# Patient Record
Sex: Female | Born: 1960 | Race: White | Hispanic: No | Marital: Single | Smoking: Current every day smoker
Health system: Southern US, Community
[De-identification: ages and names within clinical notes are randomized; demographics above are authoritative.]

## PROBLEM LIST (undated history)

## (undated) DIAGNOSIS — F319 Bipolar disorder, unspecified: Secondary | ICD-10-CM

## (undated) DIAGNOSIS — N289 Disorder of kidney and ureter, unspecified: Secondary | ICD-10-CM

## (undated) DIAGNOSIS — M1990 Unspecified osteoarthritis, unspecified site: Secondary | ICD-10-CM

## (undated) HISTORY — PX: TUBAL LIGATION: SHX77

---

## 1997-08-05 ENCOUNTER — Encounter (HOSPITAL_COMMUNITY): Admission: RE | Admit: 1997-08-05 | Discharge: 1997-11-03 | Payer: Self-pay

## 2002-05-18 ENCOUNTER — Emergency Department (HOSPITAL_COMMUNITY): Admission: EM | Admit: 2002-05-18 | Discharge: 2002-05-18 | Payer: Self-pay | Admitting: Emergency Medicine

## 2002-05-18 ENCOUNTER — Encounter: Payer: Self-pay | Admitting: Emergency Medicine

## 2003-01-11 ENCOUNTER — Encounter
Admission: RE | Admit: 2003-01-11 | Discharge: 2003-04-11 | Payer: Self-pay | Admitting: Physical Medicine & Rehabilitation

## 2003-04-15 ENCOUNTER — Encounter
Admission: RE | Admit: 2003-04-15 | Discharge: 2003-07-14 | Payer: Self-pay | Admitting: Physical Medicine & Rehabilitation

## 2003-09-08 ENCOUNTER — Encounter
Admission: RE | Admit: 2003-09-08 | Discharge: 2003-12-07 | Payer: Self-pay | Admitting: Physical Medicine & Rehabilitation

## 2003-12-14 ENCOUNTER — Encounter
Admission: RE | Admit: 2003-12-14 | Discharge: 2004-03-13 | Payer: Self-pay | Admitting: Physical Medicine & Rehabilitation

## 2004-01-07 ENCOUNTER — Ambulatory Visit: Payer: Self-pay | Admitting: Physical Medicine & Rehabilitation

## 2004-04-04 ENCOUNTER — Encounter
Admission: RE | Admit: 2004-04-04 | Discharge: 2004-07-03 | Payer: Self-pay | Admitting: Physical Medicine & Rehabilitation

## 2004-04-06 ENCOUNTER — Ambulatory Visit: Payer: Self-pay | Admitting: Physical Medicine & Rehabilitation

## 2004-07-03 ENCOUNTER — Encounter
Admission: RE | Admit: 2004-07-03 | Discharge: 2004-10-01 | Payer: Self-pay | Admitting: Physical Medicine & Rehabilitation

## 2004-07-05 ENCOUNTER — Ambulatory Visit: Payer: Self-pay | Admitting: Physical Medicine & Rehabilitation

## 2004-10-03 ENCOUNTER — Encounter
Admission: RE | Admit: 2004-10-03 | Discharge: 2005-01-01 | Payer: Self-pay | Admitting: Physical Medicine & Rehabilitation

## 2004-10-03 ENCOUNTER — Ambulatory Visit: Payer: Self-pay | Admitting: Physical Medicine & Rehabilitation

## 2004-12-05 ENCOUNTER — Ambulatory Visit: Payer: Self-pay | Admitting: Physical Medicine & Rehabilitation

## 2005-02-06 ENCOUNTER — Ambulatory Visit: Payer: Self-pay | Admitting: Physical Medicine & Rehabilitation

## 2005-02-06 ENCOUNTER — Encounter
Admission: RE | Admit: 2005-02-06 | Discharge: 2005-05-07 | Payer: Self-pay | Admitting: Physical Medicine & Rehabilitation

## 2005-04-03 ENCOUNTER — Ambulatory Visit: Payer: Self-pay | Admitting: Physical Medicine & Rehabilitation

## 2005-07-03 ENCOUNTER — Encounter
Admission: RE | Admit: 2005-07-03 | Discharge: 2005-10-01 | Payer: Self-pay | Admitting: Physical Medicine & Rehabilitation

## 2005-07-31 ENCOUNTER — Ambulatory Visit: Payer: Self-pay | Admitting: Physical Medicine & Rehabilitation

## 2005-11-30 ENCOUNTER — Encounter
Admission: RE | Admit: 2005-11-30 | Discharge: 2006-02-28 | Payer: Self-pay | Admitting: Physical Medicine & Rehabilitation

## 2005-11-30 ENCOUNTER — Ambulatory Visit: Payer: Self-pay | Admitting: Physical Medicine & Rehabilitation

## 2006-03-15 ENCOUNTER — Encounter
Admission: RE | Admit: 2006-03-15 | Discharge: 2006-06-13 | Payer: Self-pay | Admitting: Physical Medicine & Rehabilitation

## 2006-03-15 ENCOUNTER — Ambulatory Visit: Payer: Self-pay | Admitting: Physical Medicine & Rehabilitation

## 2006-04-26 ENCOUNTER — Ambulatory Visit: Payer: Self-pay | Admitting: Physical Medicine & Rehabilitation

## 2006-06-05 ENCOUNTER — Ambulatory Visit: Payer: Self-pay | Admitting: Physical Medicine & Rehabilitation

## 2006-08-01 ENCOUNTER — Ambulatory Visit: Payer: Self-pay | Admitting: Physical Medicine & Rehabilitation

## 2006-08-01 ENCOUNTER — Encounter
Admission: RE | Admit: 2006-08-01 | Discharge: 2006-10-30 | Payer: Self-pay | Admitting: Physical Medicine & Rehabilitation

## 2006-10-25 ENCOUNTER — Ambulatory Visit: Payer: Self-pay | Admitting: Physical Medicine & Rehabilitation

## 2007-01-17 ENCOUNTER — Encounter
Admission: RE | Admit: 2007-01-17 | Discharge: 2007-01-20 | Payer: Self-pay | Admitting: Physical Medicine & Rehabilitation

## 2007-03-27 ENCOUNTER — Encounter
Admission: RE | Admit: 2007-03-27 | Discharge: 2007-03-28 | Payer: Self-pay | Admitting: Physical Medicine & Rehabilitation

## 2007-03-27 ENCOUNTER — Ambulatory Visit: Payer: Self-pay | Admitting: Physical Medicine & Rehabilitation

## 2007-05-24 ENCOUNTER — Inpatient Hospital Stay (HOSPITAL_COMMUNITY): Admission: EM | Admit: 2007-05-24 | Discharge: 2007-06-04 | Payer: Self-pay | Admitting: Emergency Medicine

## 2007-05-25 ENCOUNTER — Ambulatory Visit: Payer: Self-pay | Admitting: Cardiology

## 2007-05-26 ENCOUNTER — Encounter: Payer: Self-pay | Admitting: Nephrology

## 2007-05-29 ENCOUNTER — Encounter: Payer: Self-pay | Admitting: Cardiology

## 2007-06-02 ENCOUNTER — Encounter (INDEPENDENT_AMBULATORY_CARE_PROVIDER_SITE_OTHER): Payer: Self-pay | Admitting: Interventional Radiology

## 2007-07-15 ENCOUNTER — Encounter
Admission: RE | Admit: 2007-07-15 | Discharge: 2007-07-18 | Payer: Self-pay | Admitting: Physical Medicine & Rehabilitation

## 2007-07-18 ENCOUNTER — Ambulatory Visit: Payer: Self-pay | Admitting: Physical Medicine & Rehabilitation

## 2007-11-13 ENCOUNTER — Encounter
Admission: RE | Admit: 2007-11-13 | Discharge: 2007-11-14 | Payer: Self-pay | Admitting: Physical Medicine & Rehabilitation

## 2007-11-14 ENCOUNTER — Ambulatory Visit: Payer: Self-pay | Admitting: Physical Medicine & Rehabilitation

## 2008-02-03 ENCOUNTER — Other Ambulatory Visit: Payer: Self-pay | Admitting: Emergency Medicine

## 2008-02-03 ENCOUNTER — Other Ambulatory Visit: Payer: Self-pay

## 2008-02-27 ENCOUNTER — Encounter
Admission: RE | Admit: 2008-02-27 | Discharge: 2008-05-24 | Payer: Self-pay | Admitting: Physical Medicine & Rehabilitation

## 2008-04-02 ENCOUNTER — Ambulatory Visit: Payer: Self-pay | Admitting: Physical Medicine & Rehabilitation

## 2008-05-24 ENCOUNTER — Encounter
Admission: RE | Admit: 2008-05-24 | Discharge: 2008-05-24 | Payer: Self-pay | Admitting: Physical Medicine & Rehabilitation

## 2008-12-11 ENCOUNTER — Emergency Department (HOSPITAL_COMMUNITY): Admission: EM | Admit: 2008-12-11 | Discharge: 2008-12-12 | Payer: Self-pay | Admitting: Emergency Medicine

## 2009-03-02 IMAGING — CR DG CHEST 1V PORT
1 series · 1 of 1 positions shown · non-contrast
Comparison: Fluoroscopic spot film from 05/26/2007 dialysis catheter placement.

CLINICAL DATA: 46-year-old female on dialysis, having shortness of breath.  
 PORTABLE CHEST - 1 VIEW:

[AP]
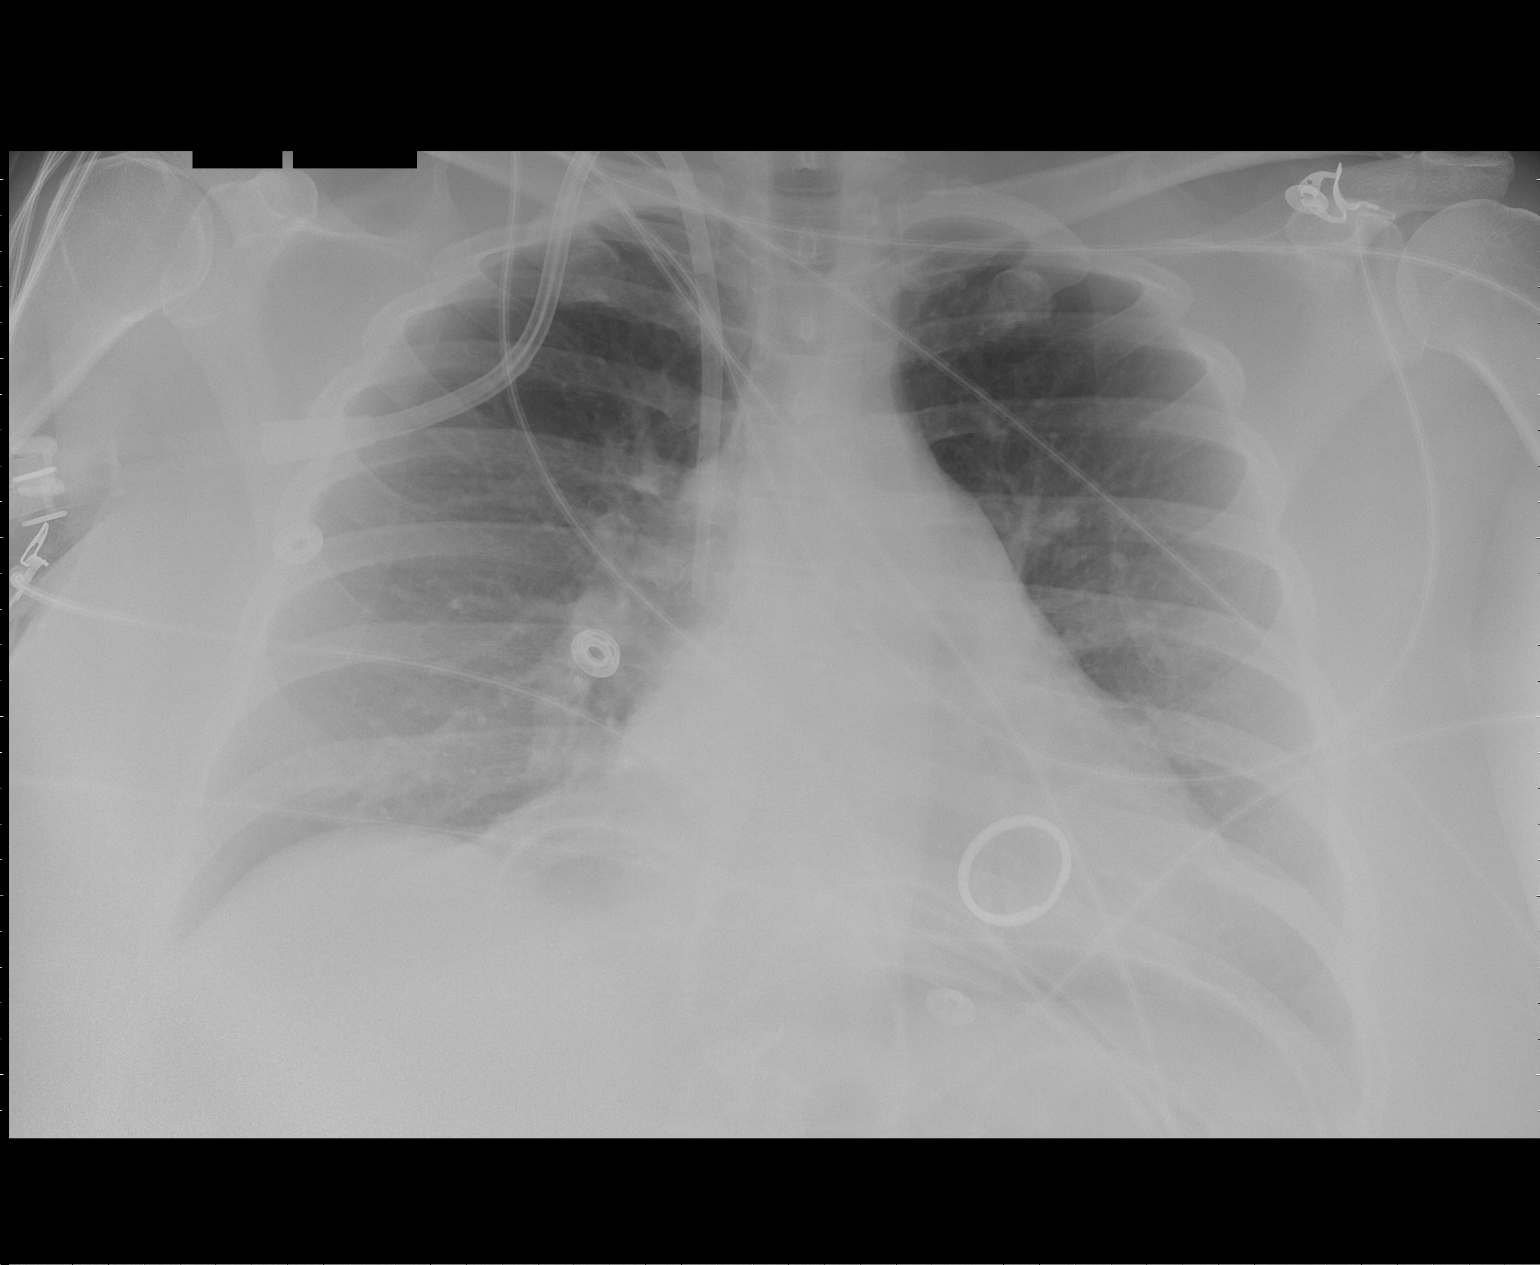

[1 of 1 positions shown; findings below may reference images not displayed]

FINDINGS: Metallic ring projects over the left heart border and is presumably external to the patient.  The right internal jugular approach dialysis catheter is stable in configuration. The tip is in the lower SVC level.  Cardiac size is normal for portable technique.  Mediastinal contour is within normal limits.  No pneumothorax.  There is hazy opacity at the left lung base which may reflect a small effusion, no definite right effusion is identified.  No consolidation is seen.  There is pulmonary vascular congestion without overt edema.  No acute osseous abnormality identified.  There is mild gaseous distention of the stomach.
IMPRESSION: 1.  Stable right IJ approach dialysis catheter, no pneumothorax.  
 2.  Pulmonary vascular congestion and possible small left effusion without overt pulmonary edema.  
 3.   Presumably external metallic artifact projecting over the heart.

## 2009-03-09 IMAGING — US US BIOPSY
1 series · 10 of 10 positions shown · non-contrast
Comparison: none

CLINICAL DATA: 46 year old female; acute renal insufficiency, hepatitis.
ULTRASOUND-GUIDED LEFT RENAL CORTEX CORE BIOPSY ? 06/02/07: 
Radiologist:  Tiger, Nala.   
Guidance:  Ultrasound.
Complications:  No immediate.
Medications: 1 mg Versed and 50 mcg fentanyl.
Procedure/Findings:   Informed consent was obtained from the patient following explanation of the procedure, risks, benefits, and alternatives.  The patient understands, agrees, and consents to the procedure.   All questions were addressed.   Time Out was performed.  
The patient was positioned prone.  Preliminary imaging was performed at the left kidney lower pole.  The overall skin was marked.  Under sterile conditions and local anesthesia, a 15 cm, 16 gauge core biopsy needle was advanced to the lower pole cortex.  Needle position was confirmed with ultrasound.  Core biopsy was obtained of the cortex.  This was performed four times.  Needle position was confirmed with ultrasound.  Images were obtained for documentation.  Samples were adequate in size and reviewed with the pathology assistant.  
The needle was removed.  Hemostasis was obtained with compression.  No immediate complications.

[Series 1: unknown · 0.33mm/px · 10 of 10 slices shown]
[im 1/10]
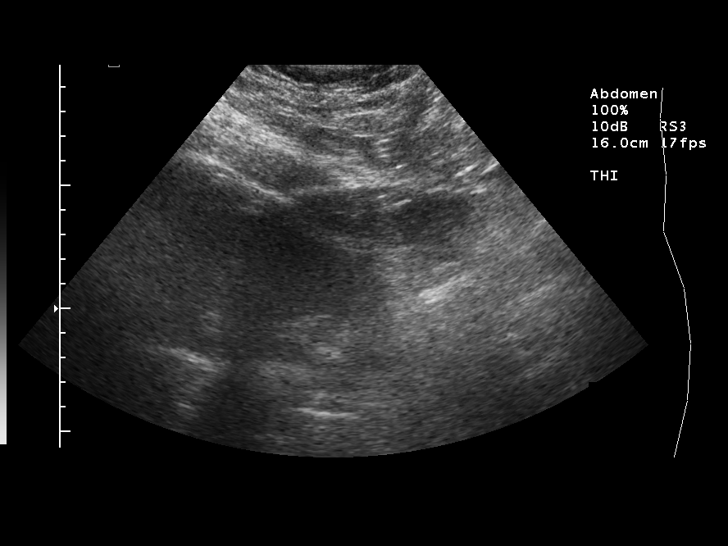
[im 2/10]
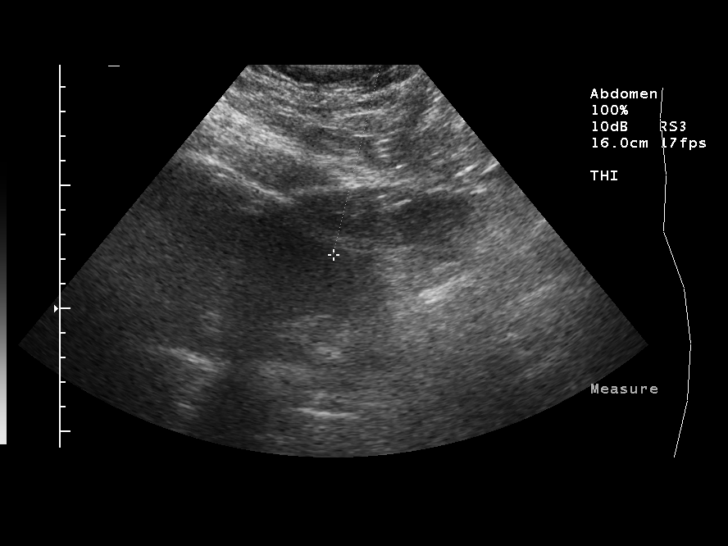
[im 3/10]
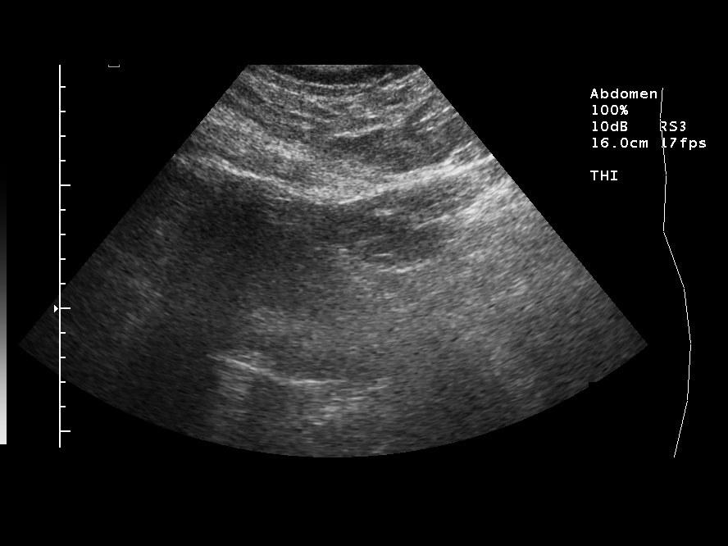
[im 4/10]
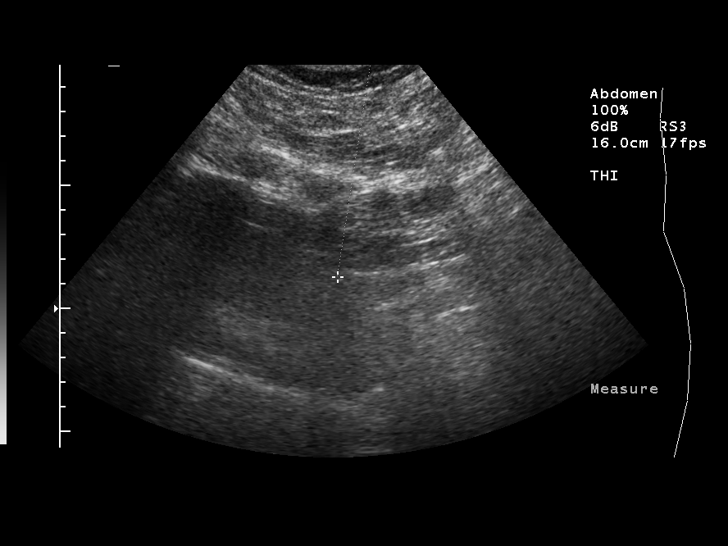
[im 5/10]
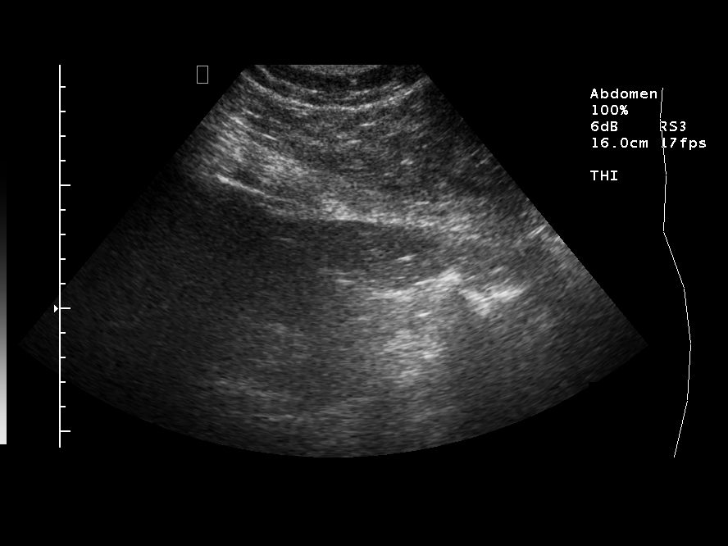
[im 6/10]
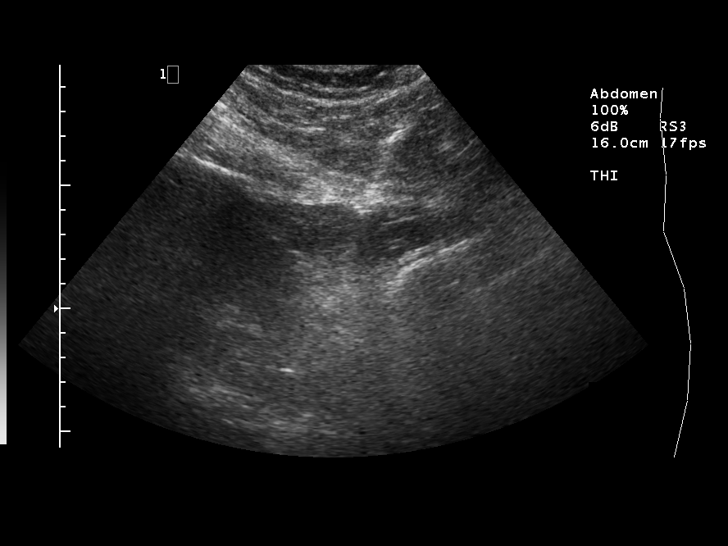
[im 7/10]
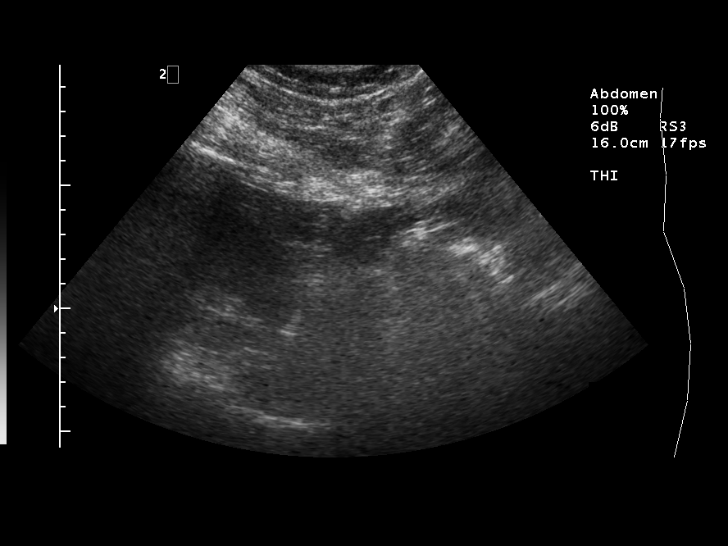
[im 8/10]
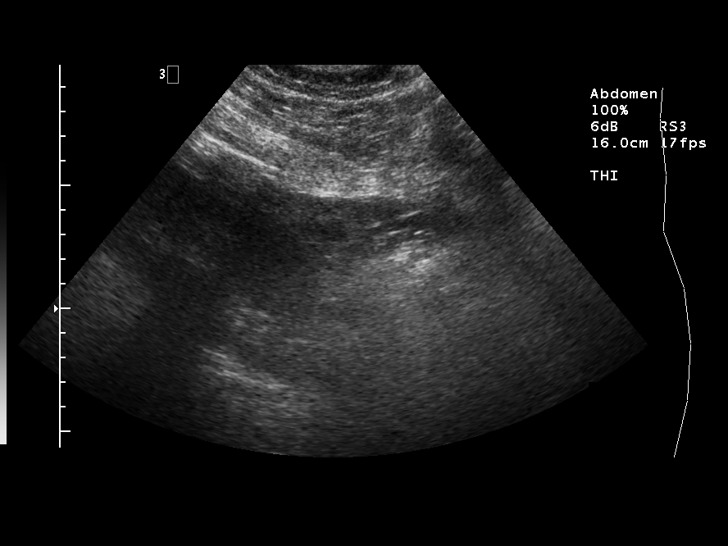
[im 9/10]
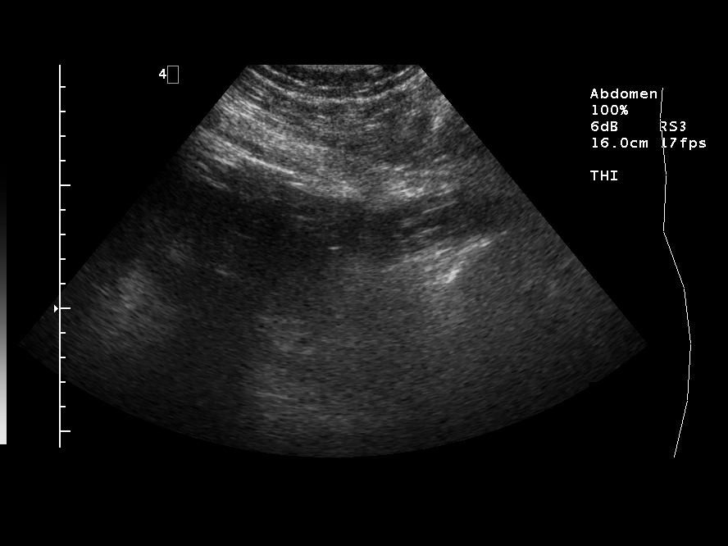
[im 10/10]
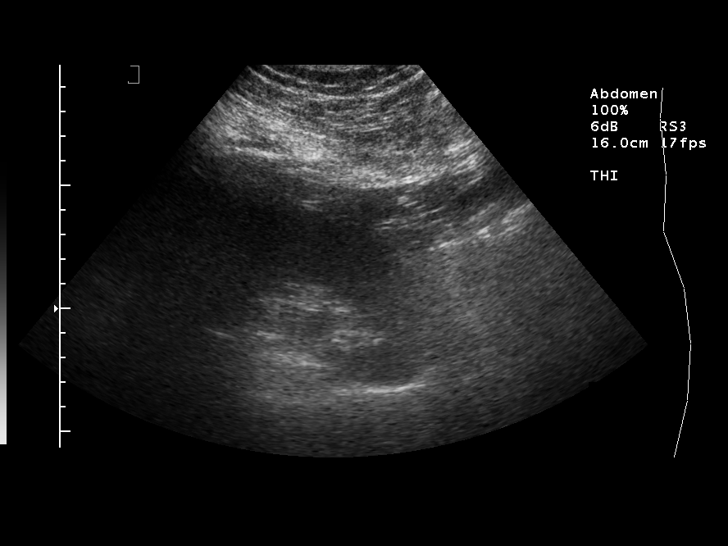

[10 of 10 positions shown; findings below may reference images not displayed]

IMPRESSION: Ultrasound-guided left renal lower pole cortex core biopsy (four 16 gauge core biopsies).

## 2009-03-28 ENCOUNTER — Emergency Department (HOSPITAL_COMMUNITY): Admission: EM | Admit: 2009-03-28 | Discharge: 2009-03-28 | Payer: Self-pay | Admitting: Emergency Medicine

## 2010-04-01 ENCOUNTER — Encounter: Payer: Self-pay | Admitting: Internal Medicine

## 2010-04-02 ENCOUNTER — Encounter: Payer: Self-pay | Admitting: Gastroenterology

## 2010-07-25 NOTE — H&P (Signed)
NAMEGUY, TONEY NO.:  [id number]   MEDICAL RECORD NO.:  [id number]          PATIENT TYPE:  INP   LOCATION:  1445                         FACILITY:  [facility]   PHYSICIAN:  Beckey Rutter, MD  DATE OF BIRTH:  09-29-1960   DATE OF ADMISSION:  05/24/2007  DATE OF DISCHARGE:                              HISTORY & PHYSICAL   PRIMARY CARE PHYSICIAN:  Unassigned to [facility].   CHIEF COMPLAINT:  Nausea and vomiting.   HISTORY OF PRESENT ILLNESS:  This is a 50 year old very pleasant  Caucasian female with past medical history significant for bipolar  disorder, obesity, chronic back pain who presented today with nausea and  vomiting.  The patient started to experience this nausea and vomiting  for the last week or so.  She denied fevers, diarrhea, but she stated  that her abdomen is sore from vomiting.  She noticed a small amount of  blood in her emesis this morning.  The symptom was described as moderate  and she could not recognize any aggravating factor.  No relieving factor  as well.  She has some dizziness but denied any similar symptoms  previously.  The patient has been visiting the pain clinic and the psych  clinic but denied any primary physician visitation.  The patient was on  multiple psychotropic medications but since she was discharged from  [facility] she is no longer taking them.   PAST MEDICAL HISTORY:  Significant for obesity, bipolar disorder,  chronic back pain.   SOCIAL HISTORY:  No drug abuse.  No ethanol abuse, not a drinker.  Lives  with her husband and family.   FAMILY HISTORY:  Noncontributory.  The patient has a twin sister.  Her  twin sister is alive and well.   MEDICATIONS:  1. Methadone 30 mg p.o. t.i.d.  2. Vicodin t.i.d. p.r.n.   REVIEW OF SYSTEMS:  Her review of systems is noncontributory.  The  patient is complaining of a hoarse voice which she stated is secondary  to the multiple vomiting the patient is experiencing  now.   PHYSICAL EXAMINATION:  VITAL SIGNS:  On examination temperature 97.9,  blood pressure 150/85, pulse 69, respiratory rate 18.  HEENT:  Head atraumatic, normocephalic.  PERRLA.  Mouth moist.  No  ulcer.  NECK:  Supple.  No JVD.  LUNGS:  Decreased air entry bilaterally.  PRECORDIUM:  Distal heart sounds could not readily appreciate.  ABDOMEN:  Obese, nontender.  Bowel sounds present.  EXTREMITIES:  No lower extremity edema.  GENITOURINARY:  No CVA tenderness.   LABS:  Urine microscopic showing many bacteria.  Urinalysis is showing  pH of 6.9, 30 mg of protein, negative nitrates and negative leukocyte  esterase.  Urine for pregnancy is negative.  Lipase is 23, sodium is  122, potassium 3.7, chloride 84, bicarb 19, glucose 123, BUN is 106,  creatinine is 12.63.  White blood count is 6.5, hemoglobin is 12.2,  hematocrit 35.6, platelet count is 167.   This is a 50 year old who presented with acute renal failure.  It is not  sure if this is acute on chronic  picture because there is no  documentation of any renal function tests or creatinine in the past few  months.  It is also not clear why the patient has acute renal failure  now.  She was not taking nephrotoxic medication at least as per the  history.   PLAN:  1. The patient will be admitted to the telemetry floor for further      assessment and management.  2. We will refrain from prescribing any nephrotoxic medication and we      will continue to monitor her renal function.  3. We will obtain a nephrology consultation.  4. For hyponatremia will have strict I's and O's monitoring and we      will check the complement levels.  Will have the blood osmolality      and the urine osmolality checked as well.  5. For DVT prophylaxis will consider Lovenox; for GI prophylaxis will      start Protonix.      Beckey Rutter, MD  Electronically Signed     EME/MEDQ  D:  05/24/2007  T:  05/25/2007  Job:  [id number]

## 2010-07-25 NOTE — Assessment & Plan Note (Signed)
Christina Rocha returns to the clinic today for follow-up evaluation.  Overall, she is doing well.  She does need refills on several medicines  in the office today.  She is off Effexor, Adderall and Trileptal at this  point, and only takes Neurontin for psychiatric problems.   MEDICATIONS:  1. Ambien 10 mg p.o. q.h.s.  2. Neurontin 600 mg q.i.d.  3. Hydrocodone 5/325 one tablet q.i.d. p.r.n. (approximately four per      day).  4. Methadone 10 mg three tablets t.i.d. (approximately nine per day).  5. Zanaflex 4 mg q.i.d.  6. ReQuip 0.25 mg b.i.d. p.r.n.  7. Lidoderm patches 5% on 12 hours and off 12 hours daily.   REVIEW OF SYSTEMS:  Positive for constipation and poor appetite.   PHYSICAL EXAMINATION:  Well-appearing, moderately-overweight adult  female in mild to no acute discomfort.  Blood pressure is 108/56 with a  pulse of 49, respiratory rate 22, and O2 saturation 98% on room air.  She has 5-/5 strength throughout the bilateral upper and lower  extremities.  She ambulates without any assistive device.   IMPRESSION:  1. Chronic low-back pain with evidence of moderate lumbar      spondylosis/degenerative disk disease.  2. Sacroiliac joint pain.  3. Bilateral hip pain.   In the office today we did refill the patient's Zanaflex along with her  methadone, Neurontin and Ambien.  No refill is necessary on the Lidoderm  patch or hydrocodone at this point.  Will plan on seeing her in follow-  up in approximately 4 months' time.           ______________________________  Ellwood Dense, M.D.     DC/MedQ  D:  07/18/2007 14:09:47  T:  07/18/2007 14:30:04  Job #:  [id number]

## 2010-07-25 NOTE — Assessment & Plan Note (Signed)
Christina Rocha returns to clinic today for followup evaluation.  She reports  that she is having a lot of anxiousness lately related to her husband's  health.  Apparently, he has gained a lot of weight and she is worried  about his overall health and where that would leave her if he actually  were to have further health problems.  She does report that she has  sufficient supply of hydrocodone, methadone, Zanaflex, and Neurontin at  the present time.  She would like to try Ativan on an as needed basis.   CURRENT MEDICATIONS:  1. Ambien 10 mg p.o. nightly.  2. Neurontin 600 mg q.i.d.  3. Hydrocodone 5/325 one tablet q.i.d. p.r.n. (approximately 4 per      day).  4. Methadone 10 mg 3 tablets t.i.d. (approximately 9 per day).  5. Zanaflex 4 mg q.i.d.  6. ReQuip 0.25 mg b.i.d. p.r.n.  7. Lidoderm patch 5% on 12 hours and off 12 hours daily.   REVIEW OF SYSTEMS:  Positive for constipation, poor appetite, and  coughing.   PHYSICAL EXAMINATION:  GENERAL:  Mildly overweight adult female in mild  acute discomfort.  VITAL SIGNS:  Blood pressure 125/77 with pulse of 62, respiratory rate  18, and O2 saturation 98% on room air.  EXTREMITIES:  She has 5/5 strength throughout the bilaterally upper and  lower extremities.  She ambulates without the assistive device.   IMPRESSION:  1. Chronic low back pain with evidence of moderate lumbar spondylosis.  2. Degenerative disk disease.  3. Sacroiliac joint pain.  4. Bilateral hip pain.   In the office today, we did start the patient on Ativan 2 mg 1 tablet  t.i.d. p.r.n., total of 90.  No refill on the other medicines are  necessary at this time.  We will plan on seeing her in followup in  approximately 3 months' time with refills prior to that appointment if  necessary.           ______________________________  Ellwood Dense, M.D.     DC/MedQ  D:  11/14/2007 12:16:39  T:  11/15/2007 03:07:13  Job #:  [id number]

## 2010-07-25 NOTE — Consult Note (Signed)
Christina Rocha, Christina Rocha NO.:  [id number]   MEDICAL RECORD NO.:  [id number]          PATIENT TYPE:  INP   LOCATION:  3711                         FACILITY:  [facility]   PHYSICIAN:  Madolyn Frieze. Jens Som, MD, FACCDATE OF BIRTH:  1960/12/11   DATE OF CONSULTATION:  05/28/2007  DATE OF DISCHARGE:                                 CONSULTATION   DATE OF ADMISSION:  May 25, 2007.   DATE OF CONSULTATION:  May 28, 2007.   CURRENT ROOM:  37 11   PRIMARY CARE PHYSICIAN:  The patient does not have a primary care  physician   PRIMARY CARDIOLOGIST:  She is new to cardiology, Madolyn Frieze. Jens Som, MD,  [facility]   SUMMARY OF HISTORY:  Ms. Mahnken is a 50 year old white female who was  admitted through [facility] when she presented with a 15-month  history of nausea, vomiting.  This was a gradual onset. However, the  week prior to admission, it became much worse and she developed  hematemesis. Due to worsening symptoms, she presented to the emergency  room. She was found to be in acute renal failure and she was  administered IV fluids without improvement and she has been in the  hospital. She has been started on dialysis and has had this performed on  March 16, 17 and 18 with improvement of her nausea, vomiting.  However,  over the preceding month, she has also noticed increased shortness of  breath and dyspnea on exertion, orthopnea and edema.  She states that  she has probably gained at least 30 pounds over the preceding month.  Since admission, she feels that this has improved only slightly.   We were asked to consult secondary to chest discomfort which she  experienced on March 16.  She described this as a anterior pressure  associated with increased shortness of breath.  However, she denied any  radiation, diaphoresis or change in her nausea. She has never had prior  occurrences.  She states it is usually 6 on a scale of 0 to 10, but  during dialysis it gets up to an 8 on  a scale of 0 to 10.  More  importantly,  the last time it was a 0 was prior to onset.  She is not  sure of any alleviating factors.  She also feels that her weight here in  the hospital is still much higher than baseline, but she is not sure  what her baseline weight is.  She is also concerned given her history of  bipolar disorder that she is not getting her Effexor, trazodone or  Neurontin while she is here in the hospital.   ALLERGIES:  IS NOTABLE FOR ALLERGY TO HALDOL.   CURRENT MEDICATIONS:  Medications prior to admission included:  1. Effexor, unknown dosage.  2. Trazodone 300 mg nightly.  3. Ambien 10 mg nightly.  4. Neurontin 600 mg q.i.d.  5. Zanaflex 4 mg q.i.d.  6. Methadone 30 mg t.i.d.  7. Vicodin 10/325 mg q.i.d.   PAST MEDICAL HISTORY:  1. Is notable for bipolar disorder since the age of 87.  She has had      at least two psych admissions one in August 2002 at [facility] and in 1995 at [facility].  2. She has a history of fibromyalgia.  3. History of degenerative joint disease, lumbar spondylosis and      bilateral hip pain for which she is seen in the Pain Clinic.  4. She also has a history of obesity.   SURGICAL HISTORY:  Is notable for bilateral tubal ligation.   Her BUN and creatinine at [facility] in July 2008 was 8 and 0.7.  She  specifically denies any diabetes, hypertension and no myocardial  infarction, COPD, thyroid disorder, bleeding dyscrasias.  She does not  know her cholesterol is and she has not seen a physician in quite  awhile.   SOCIAL HISTORY:  She resides in [place] with her husband.  She has  one child.  She is on disability and no grandchildren.  She quit smoking  about 6 weeks ago approximately half a pack per day for 20 years.  She  denies any alcohol, drugs or herbal medications, specific diet or  exercise.   FAMILY HISTORY:  Mother is alive at the age of 40 and is alive and well.  Her father is 71 with a history of  Parkinson's. She has a twin sister  age 58 without any health problems.  She does not have any brothers.   REVIEW OF SYSTEMS:  Is notable for recent chills, probable 30-pound  weight gain in the last month or so, a nosebleed on admission.  Positive  snoring per her husband.  He feels that she probably has obstructive  sleep apnea, but she has not been evaluated.  She has had a week long  history of urinary straining.  She also states that her last period was  approximately May 25, 2007 which was 2 weeks early for her.  She  describes a generalized weakness for the last week, depression, anxiety,  particularly this week since she has not been on her medications during  this hospitalization.  She has myalgias and arthralgias all over.  She  also states she has not had a bowel movement in 11 days and does feel  constipated.   CURRENT MEDICATIONS:  In the hospital include:  1. Ambien 10 mg nightly.  2. Protonix 40 mg daily.  3. Lovenox 40 nightly.  4. Nicoderm 7 daily.  5. Solu-Medrol IV 500 mg every day.  6. Nephro-Vite daily.  7. IV Venofer 100 daily.   PHYSICAL EXAM:  GENERAL:  Well-nourished, well-developed, obese white  female who was short of breath while talking. Her husband is present.  Temperature is 98.1, pulse 60 and regular, respirations 18 and regular,  blood pressure 190/94.  Sats 95% on room air.  Intake  since admission  has been approximately 6700 mL, output of 4250.  Her admission weight  was approximately 110 kg at [facility]; today is 112.9 kg.  HEENT:  Is grossly unremarkable.  NECK:  Supple without thyromegaly, adenopathy or carotid bruits.  It is  difficult to assess JVD given her neck fullness.  LUNGS:  Symmetrical excursion.  Breath sounds were diminished, but I did  not hear any overt rales, rhonchi or wheezing.  HEART:  PMI is not displaced and somewhat distant heart sounds; regular  rate and rhythm with a normal S1-S2.  I did not appreciate any  murmurs,  rubs, clicks or gallops.  All pulses are symmetrical  and intact.  I did  not appreciate any abdominal bruits.  SKIN:  Integument also appears to be intact.  ABDOMEN:  Obese.  Bowel sounds present without organomegaly, masses or  tenderness.  EXTREMITIES:  Negative cyanosis or clubbing.  I do not appreciate any  edema.  However, the patient feels that she has slight edema.  MUSCULOSKELETAL and NEURO:  Is unremarkable.   CHEST X-RAY:  On the 16th, showed pulmonary vascular congestion without  edema.  Probable small left effusion.  Renal ultrasound was  unremarkable.  She is status post right IJ vein hemodialysis catheter.  EKG was  performed in the emergency room at [facility] on the 16th;  this showed sinus bradycardia, nonspecific ST-T wave changes.  Subsequent EKG on the 17th, also did not show any changes.  H&H on the  14th was 12.2 and 35.6, normal indices, platelets 167, WBCs 6.5.  Sodium  was low at 122, potassium 3.7, BUN 106, creatinine 12.63, glucose 123.  AST and ALT were significantly elevated at 303 and 23, 42, lipase was  normal at 23.  Urinalysis was positive for protein, glucose and blood.  Serum HCG:  Negative.  HIV was nonreactive.  PTH was elevated at 454.7,  calcium/total calcium was low at 7.1; iron was low at 21, percent sat 7;  B12 greater than 2000, ferritin greater than 374. On the 17th lipase was  13.  CK MBs relative index  x2 and troponin x1 were within normal  limits.  TSH was 0.371. Today, H&H is 10.2 and 30.3, platelets 142, WBCs  8.3, normal indices.  Sodium 129, potassium 4.0, BUN 49, creatinine  8.26, glucose 146, magnesium was 2.1.   IMPRESSION:  1. Acute renal failure requiring hemodialysis.  2. Volume overload probably secondary to renal failure and diastolic      dysfunction.  3. Hypertension that has not been treated.  4. Prolonged atypical chest discomfort without evidence of ischemia      based on EKGs and enzymes.  5. Hyperglycemia  without history of diabetes.  6. Elevated LFTs, probably secondary to volume overload.  7. Wide complex tachycardia.  8. Tobacco use.  9. Normocytic anemia.  10.Hyponatremia.  11.Obesity.  12.Bipolar disorder with a history of depression, currently not      receiving any of her medications.   History is noted per past medical history disposition.  Dr. Jens Som  reviewed the patient's history, spoke with and examined the patient and  agrees with the above.   Recommend checking her hemoglobin A1c given her elevated blood sugars.  We will order an echocardiogram and an adenosine Myoview for further  evaluation of her atypical symptoms.  Given her wide complex tachycardia  in her untreated hypertension, we will begin a low-dose beta blocker as  well as Norvasc.  We will continue to follow.      Joellyn Rued, PA-C      Madolyn Frieze Jens Som, MD, [facility]  Electronically Signed    EW/MEDQ  D:  05/28/2007  T:  05/28/2007  Job:  [id number]   cc:   Ellwood Dense, M.D.  [facility]

## 2010-07-25 NOTE — Assessment & Plan Note (Signed)
Ms. Ozimek returns to the clinic today for followup evaluation.  During  the last clinic visit we had given her lidocaine and Kenalog injections  of her bilateral hips.  She is not sure that she gained much relief from  those.  She has been gaining relief from her medications, specifically  her hydrocodone, methadone and Zanaflex.  She would like to have her  Zanaflex increased to 4 mg four times a day similar to the dose that she  was taking in the hospital recently.  She has a sufficient supply of the  Neurontin and Lidoderm patches.  The patient reports that she would also  like [place] to fill the trazodone as she is having trouble getting that  filled from her primary care physician.   MEDICATIONS:  1. Effexor 75 mg one tablet daily.  2. Adderall 20 mg b.i.d.  3. Ambien 10 mg q.h.s.  4. Trileptal 50 mg b.i.d.  5. Neurontin 600 mg q.i.d.  6. Trazodone 150 mg b.i.d.  7. Hydrocodone 5/325 one tablet q.i.d. p.r.n.  8. Methadone 10 mg three tablets p.o. t.i.d.  9. Zanaflex 2 mg q.i.d.  10.ReQuip 0.25 mg b.i.d. p.r.n.  11.Lidoderm patch 5% on 12 hours and off 12 hours daily.   REVIEW OF SYSTEMS:  Noncontributory.   PHYSICAL EXAMINATION:  Well-appearing, overweight adult female in mild  to no acute discomfort.  Blood pressure 96/50 with a pulse of 67,  respiratory rate 18, and O2 saturation 99% on room air.  The patient has  5-/5 strength throughout the bilateral upper and lower extremities.   IMPRESSION:  1. Chronic low-back pain with evidence of moderate lumbar      spondylosis/degenerative disc disease.  2. Sacroiliac joint pain.  3. Bilateral hip pain.   In the office today we did increase the patient's Zanaflex to 4 mg one  tablet q.i.d. p.r.n.  We also refilled her trazodone.  She has a  sufficient supply of hydrocodone, methadone, Neurontin, and Lidoderm at  this point.  We will plan on seeing her in followup in approximately 3  months' time with refills prior to that  appointment as necessary.           ______________________________  Ellwood Dense, M.D.     DC/MedQ  D:  10/28/2006 15:33:59  T:  10/29/2006 08:59:31  Job #:  [id number]

## 2010-07-25 NOTE — Consult Note (Signed)
Christina Rocha, Christina Rocha NO.:  [id number]   MEDICAL RECORD NO.:  [id number]          PATIENT TYPE:  INP   LOCATION:  1445                         FACILITY:  [facility]   PHYSICIAN:  Maree Krabbe, M.D.DATE OF BIRTH:  05-Jul-1960   DATE OF CONSULTATION:  DATE OF DISCHARGE:                                 CONSULTATION   REASON FOR ADMISSION:  Renal failure.   HISTORY:  The patient is a 50 year old white female with a history of  bipolar disorder and chronic back pain.  She is on numerous pain and  psychiatric medications.  She is admitted with a 6 day history of severe  nausea and vomiting all week long.  She also had fevers and chills  earlier in the week.  She was found to have severe azotemia with a BUN  of 110 and a creatinine of 12.6.  She denies any use of over-the-counter  NSAIDs or any prescription COX-2 agents.  Blood pressure was okay on  admission.  She does describe decreased urine output this week but no  change in urine color, no tea-colored urine or dysuria or flank pain.  She denies any abdominal pain, chest pain, shortness of breath or  diarrhea.  She denies any bloody diarrhea.  She has no other complaints.   PAST MEDICAL HISTORY:  Bipolar disorder, chronic back pain.   HOME MEDICATIONS:  1. Effexor 75 once a day.  2. Adderall 20 b.i.d.  3. Trileptal 50 b.i.d.  4. Neurontin 600 mg q.i.d.  5. Trazodone 150 twice a day.  6. Methadone 300 t.i.d.  7. Zanaflex 4 mg q.i.d.  8. Requip 0.25 mg b.i.d.   ALLERGIES:  HALDOL, PROLIXIN, ERYTHROMYCIN.   SOCIAL HISTORY:  She is accompanied by her twin sister here.  No drug,  alcohol or tobacco use.   REVIEW OF SYSTEMS:  As above.   PHYSICAL EXAM:  VITAL SIGNS:  Blood pressure 160/90, temperature 98.1,  pulse 60, respirations 20, O2 sat 96% on room air.  GENERAL:  This is a healthy-appearing, middle-aged white female in no  distress.  She is moderately obese.  SKIN:  She has skin that is warm and dry  without rash.  HEENT:  PERRLA, EOMI.  Throat is slightly dry.  NECK:  Neck is supple with flat neck veins.  CHEST:  Clear throughout.  No rales or wheezes.  Clear all the way to  the bases.  CARDIAC:  Regular rate and rhythm with soft systolic ejection.  No  murmur, rub or gallop.  ABDOMEN:  Abdomen is obese, soft, nontender, no ascites.  EXTREMITIES:  Well perfused, no edema, no gangrene or cyanosis.  NEUROLOGICAL:  Alert and oriented x3, nonfocal motor exam.  No  asterixis.   LABS:  Sodium 123, BUN 102, creatinine 12.5, potassium 3.6, bicarb 21,  hemoglobin 12, white blood count 6000, platelets 167, AST elevated at  303, ALT elevated at 2342, albumin 3.3, total bili and alkaline  phosphatase are normal.  X-rays are pending.  Urinalysis showed 30  protein and negative for nitrate, negative for blood.  No red blood  cells or white  blood cells.   IMPRESSION:  1. Renal failure probably acute with bland urinalysis.  She is on      multiple psychiatric medications.  She could have an acute      interstitial nephritis due to one of these.  She almost certainly      was volume depleted on admission so has room for more volume to be      given.  Her urine in the Foley bag is clear which is encouraging.  2. Hyponatremia, needs isotonic fluids.  3. Elevated AST and ALT.  I am not sure what this is due to.  She      could have hepatitis associated renal disease such as hepatitis C      related MPGN.   RECOMMENDATIONS:  Resume IV fluids, get renal ultrasound, SPEP, UPEP and  hepatitis panel to start with and aggressive fluid administration at  this point I think is the best; hopefully this is just acute renal  failure due to volume depletion and/or ATN.      Maree Krabbe, M.D.  Electronically Signed     RDS/MEDQ  D:  05/24/2007  T:  05/25/2007  Job:  [id number]

## 2010-07-25 NOTE — Assessment & Plan Note (Signed)
Ms. Elford returns to clinic today for followup evaluation.  She needs  refills on numerous medicines.  She also requested she have a slip for a  TENS unit and a K-pad for her low back.  She also requests injections of  her SI joints and her bilateral hips in the office today.  She continues  to use the methadone, as noted below, along with hydrocodone, but those  have recently been refilled.  She does need a refill on her ReQuip,  Lidoderm patches, Voltaren and Neurontin.   MEDICATIONS:  1. Effexor 75 mg one tablet daily.  2. Adderall 20 mg b.i.d.  3. Ambien 10 mg q.h.s.  4. Trileptal 50 mg b.i.d.  5. Neurontin 600 mg q.i.d.  6. Trazodone 150 mg b.i.d.  7. Hydrocodone 5/325 one tablet q.i.d. p.r.n.  8. Methadone 10 mg three tablets p.o. t.i.d.  9. Zanaflex 2 mg q.i.d.  10.ReQuip 0.25 mg b.i.d. p.r.n.  11.Lidoderm patch 5%, on 12 hours and off 12 hours daily.   REVIEW OF SYSTEMS:  Positive for fever and chills, night sweats,  constipation, nausea, poor appetite and limb swelling of her legs.   PHYSICAL EXAM:  A reasonably well appearing adult female in obvious  manic phase with rapid shifts of speech.  Blood pressure was 145/88 with  a pulse of 84, respiratory rate 18 and O2 saturation 97% on room air.  She complains of pain of her bilateral hips along with her SI joints  bilaterally.   IMPRESSION:  1. Chronic low back pain with evidence of moderate lumbar      spondylosis/degenerative disk disease.  2. Sacroiliac joint pain.  3. Bilateral hip pain.   In the office today we did refill the patient's Voltaren along with her  Neurontin and ReQuip.  No refill on the methadone or oxycodone is  necessary in the office today.  We also refilled her Lidoderm patches.  We gave her a script for a K-pad along with referral for physical  therapy to be fitted with a TENS unit.  We also gave a form completed  for a handicapped parking sticker.   I prepared 3 syringes, each with 3 mL of 1%  lidocaine and 0.5 mL of  Kenalog 40.  I had the patient sign a consent form and then had her  localize her pain of her bilateral hips.  Each hip was cleansed with  Betadine swab and then an alcohol pad and injected with the mixture of  Kenalog and lidocaine.  She tolerated the procedure well at both hip  joints.  I then cleansed her SI joints bilaterally and injected each  site with approximately 1 mL of lidocaine 1% and 0.5 mL of Kenalog 40.  Each site had good hemostasis and Band-Aids were placed over the  injection sites.  She tolerated the procedures well.   Will plan on seeing the patient in followup in this office in  approximately 3 months' time with refills prior to that appointment as  necessary.           ______________________________  Ellwood Dense, M.D.     DC/MedQ  D:  08/02/2006 14:26:19  T:  08/02/2006 15:12:44  Job #:  [id number]

## 2010-07-25 NOTE — Discharge Summary (Signed)
NAME:  Christina Rocha, Christina Rocha NO.:  [id number]   MEDICAL RECORD NO.:  [id number]          PATIENT TYPE:  INP   LOCATION:  3711                         FACILITY:  [facility]   PHYSICIAN:  Lonia Blood, M.D.       DATE OF BIRTH:  [birth year]   DATE OF ADMISSION:  05/25/2007  DATE OF DISCHARGE:                               DISCHARGE SUMMARY   PRIMARY CARE PHYSICIAN:  Tally Joe, M.D. with [facility]   DISCHARGE DIAGNOSES:  1. Acute renal failure, unclear etiology - improving.  2. Hypertension, stage 3.  3. Chronic pain syndrome, on chronic methadone.  4. Bipolar disorder.  5. Episodes of nonsustained ventricular tachycardia and chest pain,      status post Myoview stress test negative for inducible ischemia.  6. Elevated transaminases, unclear etiology, outpatient workup      indicated.  7. Anemia, workup towards cause does not indicate a clear etiology.  8. Steroid-induced hyperglycemia, resolved.  9. Tobacco abuse.  10.Morbid obesity.  11.Suspected obstructive sleep apnea.  12.Hyponatremia, resolved.   DISCHARGE MEDICATIONS:  Discharge medications will be dictated at the  time of the actual discharge of this patient.   FOLLOWUP:  The patient will follow up with Dr. Azucena Cecil on June 12, 2007.  The patient will also follow up with Dr. Camille Bal from [facility] on Friday, June 13, 2007, at 8:30 a.m.  Further  instructions to be given on the day of the actual discharge of this  patient.   PROCEDURES DURING THIS ADMISSION:  1. On May 26, 2007, a portable chest x-ray with findings of some      pulmonary vascular congestion.  2. On May 26, 2007, right internal jugular dialysis catheter      replacement per interventional radiology.  3. On May 24, 2007, renal ultrasound, findings of normal size      kidneys.  No hydronephrosis.  4. On May 30, 2007, myocardial nuclear medicine, Myoview stress      test, findings of ejection fraction  of 70%.  No evidence of      myocardial ischemia.  5. On June 02, 2007, a renal biopsy per interventional radiology,      results pending.   CONSULTATION DURING THIS ADMISSION:  The patient was seen in  consultation by the [facility] and the [facility].   HISTORY AND PHYSICAL:  Refer to the dictated H and P which was done by  Dr. Tamsen Roers on May 24, 2007.   HOSPITAL COURSE:  1. Acute renal failure.  Ms. Buboltz was admitted to an Intensive Care      Unit after she was found with a BUN of 102 and creatinine of 12.5.      In the outpatient basis, Ms. Formanek was on multiple medications      including Effexor, Adderall, Trileptal, Neurontin, trazodone,      methadone, Zanaflex, and ReQuip.  It is very difficult  to      ascertain if any of these medications may have played the role into      her renal failure.  In any case, Ms. Vanwart received full workup      with complement levels, ANCA panel, ANA levels, anti-DNA      antibodies, anti-glomerular basal membrane antibodies, hepatitis B      and C serology, and HIV serology.  The only positive findings were      decreased complement levels.  We cannot pinpoint to the etiology of      hypocomplementemia.  Ms. Spilman was started on hemodialysis and she      received this procedure on May 26, 2007, May 27, 2007, May 28, 2007, and May 30, 2007.  The patient's renal function has      continued to improve, and her urine output has increased on a daily      basis.  At the time of my dictation, the patient is on the polyuric      phase.  Her current urine output is about 6000 mL per 24 hours.  We      have started the patient on intravenous fluids to keep up with the      degree of polyuria.  This has allowed the renal function to      stabilize to a creatinine of 3.0.  We will remove the intravenous      fluids today and see if the patient is able to take adequate oral      hydration.  Ms. Schmelzle has  received pulse-dose steroids as well as      maintenance steroids, and we feel that this may have helped in the      recovery process of her renal failure.  2. Episode of chest pain with nonsustained ventricular tachycardia.      Ms. Whipkey was seen by the [facility], Dr. Jens Som.      He performed a Myoview stress test, which was essentially within      normal limits.  The patient was started on a low-dose beta-blocker,      and she did not have any recurrence of her ventricular tachycardia.  3. Bipolar disorder.  We have elected to trim down the medications      that Ms. Lahue was using.  She seems to be doing excellent on      trazodone at bedtime, Neurontin 4 times a day, Effexor daily, and      methadone 3 times a day.  4. Severe hypertension.  Ongoing titration of antihypertensives      happened throughout this hospitalization.  We could not use      unfortunately ACE inhibitors and diuretics given the current renal      failure and volume problems.  The patient will follow up with her      new primary care physician for continuation of titration of her      mediation and consideration for workup for possible secondary      causes of hypertension.      Lonia Blood, M.D.  Electronically Signed     SL/MEDQ  D:  06/03/2007  T:  06/04/2007  Job:  [id number]   cc:   Duke Salvia. Eliott Nine, M.D.

## 2010-07-25 NOTE — Assessment & Plan Note (Signed)
Christina Rocha returns to clinic today for followup evaluation.  She reports  that overall she is doing well.  She does need a refill on her Zanaflex  in the office today, but reports that the increase to 4 mg 4 times a day  is helping.   MEDICATIONS:  1. Effexor 75 mg daily.  2. Adderall 20 mg b.i.d.  3. Ambien 10 mg nightly.  4. Trileptal 50 mg b.i.d.  5. Neurontin 600 mg q.i.d.  6. Trazodone 150 mg b.i.d.  7. Hydrocodone 5/325 one tablet q.i.d. p.r.n.  8. Methadone 10 mg 3 tablets p.o. t.i.d.  9. Zanaflex 4 mg p.o. q.i.d.  10.Requip 0.25 mg b.i.d. p.r.n.  11.Lidoderm patch 5% on 12 hours and off 12 hours daily.   REVIEW OF SYSTEMS:  Noncontributory.   PHYSICAL EXAMINATION:  A well-appearing, moderately overweight adult  female in mild to no acute discomfort.  Blood pressure 146/80 with a pulse of 97, respiratory rate 18, and O2  saturation 100% on room air.  She is 5-/5 strength throughout the bilateral upper and lower  extremities.  She ambulates without any assistive device.   IMPRESSION:  1. Chronic low back pain with evidence of moderate lumbar      spondylosis/degenerative disk disease.  2. Sacroiliac joint pain.  3. Bilateral hip pain.   In the office today we did refill the patient's Zanaflex at 4 mg q.i.d.  p.r.n.  We gave her 3 refills on that medication.  She has a sufficient  supply of her other pain medicines at this point.  We will plan on  seeing her followup in approximately 4 months' time with refills prior  to that appointment as necessary.           ______________________________  Ellwood Dense, M.D.     DC/MedQ  D:  03/28/2007 14:26:12  T:  03/28/2007 14:54:27  Job #:  [id number]

## 2010-07-25 NOTE — Assessment & Plan Note (Signed)
Christina Rocha returns to clinic today for followup evaluation.  She reports  that she is doing well on her present medication.  She is really not  using the Lidoderm patches at this time.  She only needs a refill on her  methadone in the office today.  She reports that she tried to remain as  active as possible and has been visiting friends and families over the  holidays.   REVIEW OF SYSTEMS:  Positive for constipation, nausea, night sweats, and  wheezing.   MEDICATIONS:  1. Ambien 10 mg nightly.  2. Neurontin 600 mg q.i.d.  3. Hydrocodone 5/325 one tablet q.i.d. p.r.n. (approximately 4 per      day).  4. Methadone 10 mg 3 tablets t.i.d. (approximately 9 per day).  5. Zanaflex 4 mg q.i.d.  6. ReQuip 0.25 mg b.i.d. p.r.n.   The patient reports that her pain is interfering with homemaking,  traveling, social life, sleeping, standing, sitting, walking and lifting  along with personal care.   PHYSICAL EXAMINATION:  GENERAL:  Mildly overweight adult female in mild-  to-no acute discomfort.  VITAL SIGNS:  Blood pressure 92/56 with a pulse of 52, respiratory rate  18, and O2 saturation 97% on room air.  EXTREMITIES:  She has 5/5 strength throughout the bilateral upper and  lower extremities.  She ambulates without any assistive device.   IMPRESSION:  1. Chronic low back pain with evidence of moderate lumbar spondylosis.  2. Bilateral hip pain.  3. Degenerative disk disease.  4. Sacroiliac joint pain.   In the office today, we did refill the patient's methadone as of April 09, 2008.  We will plan on seeing the patient in followup in this office  in approximately 1 month's time either with myself or with the nurses.           ______________________________  Christina Rocha, M.D.     DC/MedQ  D:  04/02/2008 09:27:13  T:  04/02/2008 21:36:45  Job #:  [id number]

## 2010-07-28 NOTE — Assessment & Plan Note (Signed)
Christina Rocha returns to the clinic today for follow-up evaluation.  She  reports that she is doing well overall.  She does have some restless leg  symptoms and would like to try a prescription Requip which she has seen  advertised on TV.  She does need refill on several of her medications as  noted below.  She reports that overall her pain medications are working well  for her at the present time.   MEDICATIONS:  1.  Hydrocodone 5/325 one tablet t.i.d. p.r.n. (approximately three per      day).  2.  Voltaren 75 mg one tablet daily.  3.  Methadone 10 mg two tablets t.i.d.  4.  Effexor 250 mg daily.  5.  Temazepam 30 mg nightly.  6.  Tegretol 200 mg t.i.d.  7.  Neurontin 600 mg q.8h.  8.  Seroquel 100 mg b.i.d.  9.  Zanaflex 2 mg t.i.d.  10. Potassium chloride 10 mEq daily.  11. Lidocaine patch 5% on 12 hours and off 12 hours daily p.r.n.   PHYSICAL EXAMINATION:  GENERAL APPEARANCE:  A well-appearing fit adult  female in no acute to mild discomfort.  VITAL SIGNS:  Blood pressure 125/70, pulse 93, respiratory rate 16 and O2  saturation 99% on room air.  NEUROLOGIC:  She has 5-/5 strength throughout the bilateral upper and lower  extremities.  Bulk and tone were normal.  Reflex were 2+ and symmetrical.  She ambulates without assistive device.   IMPRESSION:  Chronic low back pain with evidence of moderate lumbar  spondylosis/degenerative disc disease.   In the office today, we did refill the patient's Zanaflex along with her  Voltaren, methadone and hydrocodone. We also gave her a new prescription for  Requip at 0.25 mg one tablet p.o. t.i.d.  We will see how she responds to  that in terms of her restless leg symptoms.  Will plan on seeing the patient  in follow-up in this office in approximately two to three months time with  refill of medication prior to that appointment as necessary.           ______________________________  Ellwood Dense, M.D.     DC/MedQ  D:  02/07/2005  14:25:51  T:  02/08/2005 09:51:28  Job #:  [id number]
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
## 2010-07-28 NOTE — Assessment & Plan Note (Signed)
Ms. Kauer returns to the clinic today for follow up evaluation.  She  reports that overall she is doing well.  During the last clinic visit we had  increased her methadone to 10 mg 2 tablets p.o. b.i.d.  She does report that  she is feeling much better and more active on that increased dose of  methadone.  She continues to take the hydrocodone 3 times a day as needed;  in addition to, the start of the Zanaflex at 2 mg t.i.d. in place of her  Flexeril medication. All of these changes are working well for her at the  present time.   She has been caring for her boyfriend who recently hospitalized with a  severe skin infection.  She is doing dressing changes on his leg at home on  a daily basis.  Her sister is in drug detoxification and was previously seen  in this office by Dr. Stevphen Rochester.   MEDICATIONS:  1. Hydrocodone 5/325 one tablet t.i.d. p.r.n.  2. Voltaren 75 mg b.i.d.  3. Methadone 10 mg 2 tablets b.i.d.  4. Effexor 75 mg daily.  5. Temazepam 30 mg q.h.s.  6. Depakote 500 mg 3 tablets q.h.s.  7. Inderal 10 mg daily.  8. Geodon 80 mg daily.  9. Neurontin 600 mg t.i.d.  10.      Trileptal 150 mg daily.  11.      Zanaflex 2 mg t.i.d.  12.      Potassium Chloride 10 mEq daily.   PHYSICAL EXAM:  GENERAL:  A well-appearing, adult female in no acute  distress.  VITAL SIGNS:  Blood pressure was 103/68 with a pulse of 59, and an O2  saturation of 97% on room air.  MUSCULOSKELETAL/NEUROLOGIC:  She has strength of 5 minus/5 of the bilateral  upper and lower extremities.  Bulk and tone were normal and reflexes are 2+  and symmetrical.  Sensation was intact to light touch throughout.  The  patient ambulates without any assistive device.   IMPRESSION:  Chronic low back pain with evidence of moderate lumbar  spondylosis/degenerative disk disease.   In the clinic today I did refill the patient's hydrocodone, methadone, and  Voltaren.  We will plan on seeing her in follow up in  approximately 2 months  time.  She is doing very well at this point, and I will refill medications  as necessary in 1 month.      Ellwood Dense, M.D.   DC/MedQ  D:  05/17/2003 11:37:41  T:  05/17/2003 12:01:41  Job #:  [id number]

## 2010-07-28 NOTE — Assessment & Plan Note (Signed)
Mrs. Christina Rocha returns to clinic today for followup evaluation.  She does  need a refill on her methadone along with her hydrocodone in the office  today.  She reports that she has a sufficient supply of Anaflex and  Neurontin.  She also request that I refill her Effexor and trazodone as  she has been having a hard time getting in to see her psychiatrist.  She  is off the Tegretol and Seroquel along with the Strattera completely at  this time.  She plans to followup with Dr. Delanna Notice, her primary care  physician, this afternoon at 3:00 p.m. for cold symptoms that she has  had for awhile.   MEDICATIONS:  1. Hydrocodone 5/325 one tablet t.i.d. p.r.n.  2. Methadone 10 mg 2 tablets q.i.d.  3. Effexor 250 mg nightly.  4. Trazodone 150 mg nightly.  5. Neurontin 600 mg t.i.d.  6. Zanaflex 2 mg t.i.d.  7. Lidocaine patch 5% on 12 hours and off 12 hours daily.  8. Potassium chloride 10 mEq daily.   REVIEW OF SYSTEMS:  Noncontributory.   PHYSICAL EXAMINATION:  GENERAL:  Ill appearing, adult female with cold  symptoms with fair congestion in the office today.  VITAL SIGNS:  Blood pressure was 117/80 with a pulse of 69, respiratory  rate 16 and O2 sat 97% on room air.  EXTREMITIES:  She has 4+ to 5- over 5 strength with her bilateral upper  and lower extremities.  She ambulates without any assisted device.   IMPRESSION:  1. Chronic lower back pain with evidence of moderate lumbar      spondylosis/degenerative disc disease.  2. In the office today, we did refill the patient's Effexor and her      trazodone for her until she can get back to see her psychiatrist.      Additionally, we refilled her hydrocodone and methadone as noted      above.  She continues to be compliant with medication with no signs      of diversion with good analgesic effect.  We will plan on seeing      the patient in followup in approximately 3 months' time with      refills prior to that appointment if necessary.           ______________________________  Ellwood Dense, M.D.     DC/MedQ  D:  03/18/2006 12:37:52  T:  03/18/2006 13:12:53  Job #:  [id number]

## 2010-07-28 NOTE — Assessment & Plan Note (Signed)
Christina Rocha returned to clinic today for followup evaluation.  She reports  that she is using her methadone but does not get relief throughout the 24  hours of the day.  She reports that she generally takes two tablets of  methadone three times a day and gets relief for approximately four to six  hours with each dose.  She reports that she would like a refill on that  medicine and possibly an adjustment.   The patient has lost 15 pounds since last visit.  She has also quit tobacco  use.  She is getting a new psychiatrist and has a backup through mental  health in her county.  The patient would like a refill on the hydrocodone  along with the Neurontin, Zanaflex and trazodone in the office today.   MEDICATIONS:  1. Hydrocodone 5/325 mg one tablet t.i.d. p.r.n.  2. Voltaren 75 mg daily.  3. Methadone 10 mg two tablets t.i.d.  4. Effexor 250 mg daily.  5. Trazodone 150 mg at night.  6. Tegretol 200 mg t.i.d.  7. Neurontin 600 mg t.i.d.  8. Seroquel 100 mg b.i.d.  9. Zanaflex 2 mg t.i.d.  10.Lidocaine patch 5% on 12 hours and off 12 hours daily.  11.Potassium chloride 10 mEq daily.   REVIEW OF SYSTEMS:  Positive for night sweats, weight loss, constipation,  poor appetite, and occasional shortness of breath.   PHYSICAL EXAMINATION:  GENERAL:  Well-appearing, slightly overweight, adult  female in mild to no acute discomfort.  VITAL SIGNS: Blood pressure 135/95, with pulse 84, respiratory rate 16, and  oxygen saturation 97% on room air.  NEUROLOGIC: She has 5-/5 strength in the bilateral upper and lower  extremities.  She ambulates without any assistive devices.   IMPRESSION:  Chronic low back pain with evidence of moderate lumbar  spondylosis, degenerative disk disease.   In the office today we did refill the patient's Zanaflex, hydrocodone,  trazodone, Neurontin. We also gave her a new prescription for methadone use  10 mg two tablets p.o. q.i.d. up from t.i.d.  This should give  her better  pain relief throughout the day.  We will plan on seeing her and follow up in  approximately four months with refills prior to that appointment as  necessary.           ______________________________  Christina Rocha, M.D.     DC/MedQ  D:  12/03/2005 12:07:31  T:  12/05/2005 02:42:40  Job #:  [id number]

## 2010-07-28 NOTE — Assessment & Plan Note (Signed)
FOLLOWUP OFFICE NOTE   Christina Rocha returns to clinic today for followup evaluation.  She reports  that she recently admitted herself to the Psych Ward at [facility]  for 5 days, May 26, 2006 through May 29, 2006.  She reports that she  was in a manic phase and that they adjusted her medicines while she was  hospitalized.  She reports that she has a followup due for July 01, 2006 at [facility].  She is still looking for a private  psychiatrist that she could see on a regular basis.   During the hospitalization, she had Adderall along with Trileptal and  her Effexor dose was adjusted down.  She continues to take the pain  medicines as noted below and gets good benefit overall.  She does  require joint injections of her bilateral hips in the office today.  We  did do trigger point injections of her SI joints during the last visit  on April 29, 2006, and she reports that those did give her some  benefit.   MEDICATIONS:  1. Effexor 75 mg once daily.  2. Adderall 20 mg b.i.d.  3. Ambien 10 mg one at bedtime.  4. Trileptal 50 mg b.i.d.  5. Neurontin 600 mg t.i.d.  6. Trazodone 150 mg b.i.d.  7. Hydrocodone 5/325 1 tablet q.i.d. p.r.n.  8. Methadone 10 mg 3 tablets p.o. t.i.d.  9. Zanaflex 2 mg q.i.d.  10.Ativan 0.5 mg q.i.d. (not taking at present until funds available).   REVIEW OF SYSTEMS:  Noncontributory.   PHYSICAL EXAMINATION:  Reasonably well-appearing adult female in obvious  mild manic phase in the office today.  Blood pressure is 139/91 with  pulse 97, respiratory rate 18 and O2 saturation 97% on room air.  She  has 4+/5 strength throughout.   IMPRESSION:  1. Chronic low back pain with evidence of moderate lumbar      spondylosis/degenerative disk disease.  2. SI joint pain.  3. Bilateral hip pain.   In the office today we did have the patient sign a consent form for  major joint injections.  I prepared a 5 cc syringe with 2 cc of  Xylocaine.  I also  added 1 cc of Kenalog 40 to each of the syringes.  After the patient had signed consent, we had chaperone present for the  hip injections.  I cleansed each hip with a Betadine swab and then an  alcohol pad.  Each hip was injected with 3 cc of the above-noted  mixture.  The patient tolerated the procedure well and good hemostasis  was obtained at the injection site.  She was able to walk out under her  own power.   We also refilled the patient's methadone along with her Zanaflex and  hydrocodone in the office today.  Those scripts were as of June 15, 2006.  We will plan on seeing the patient in followup in approximately 2-  3 months' time with refills prior to that appointment as necessary.           ______________________________  Christina Rocha, M.D.     DC/MedQ  D:  06/10/2006 15:33:44  T:  06/10/2006 18:55:56  Job #:  [id number]

## 2010-07-28 NOTE — Assessment & Plan Note (Signed)
MEDICAL RECORD NUMBER:  [id number]   Christina Rocha returns to clinic today for follow-up evaluation. She reports  that she is doing reasonably well. She does take her methadone 10 mg two  tablets twice a day and she reports that she gets relief for a few hours  afterwards. She wonders about possibly increasing that dose. She also takes  hydrocodone, Voltaren, Zanaflex, and Neurontin as noted below. She is under  a fair amount of stress right now as her common-law husband has recently  lost his job. They are living on her SSI payments and still having problems.  They are planning to probably have to move to [place], or at least low  income housing.   MEDICATIONS:  1.  Hydrocodone 5/325 one tablet t.i.d. p.r.n. (approximately three per      day).  2.  Voltaren 75 mg one tablet daily.  3.  Methadone 10 mg two tablets b.i.d.  4.  Effexor 250 mg daily.  5.  Temazepam 30 mg q.h.s.  6.  Tegretol 200 mg t.i.d.  7.  Neurontin 600 mg q.i.d.  8.  Seroquel 100 mg b.i.d.  9.  Zanaflex 2 mg t.i.d.  10. Potassium chloride 10 mEq daily.  11. Lidocaine patch 5% one on 12 hours and off 12 hours daily p.r.n.   PHYSICAL EXAMINATION:  Well-appearing, slightly overweight adult female in  moderate acute discomfort. Blood pressure 124/78 with a pulse of 79,  respiratory rate 16, and O2 saturation 98% on room air. She has 5-/5  strength throughout the bilateral upper and lower extremities. Bulk and tone  were normal and reflexes were 2+ and symmetrical. Sensation was intact to  light touch throughout. She ambulates without any assistive device.   IMPRESSION:  Chronic low back pain with evidence of moderate lumbar  spondylosis/degenerative disc disease.   In the office today we did increase her methadone to 10 mg two tablets p.o.  t.i.d. from b.i.d. She will continue on the same amount of hydrocodone,  Voltaren, Zanaflex, and Neurontin. No refills were necessary in the office  today for those medications.  Will plan on seeing her in follow-up in  approximately 2-3 months' time.           ______________________________  Ellwood Dense, M.D.     DC/MedQ  D:  12/06/2004 15:35:11  T:  12/07/2004 09:40:07  Job #:  [id number]

## 2010-07-28 NOTE — Assessment & Plan Note (Signed)
INTERVAL HISTORY:  Ms. Ripp returns to clinic today for followup  evaluation.  She reports that she has had some stress situations recently.  She still needs to see her psychiatrist and she plans to see him in the next  month or so.  She has had some stress in her life recently and that has  apparently increased her pain level.  She feels that she gets some relief  from the hydrocodone and would like to have an adjustment to that if  possible.  She has a sister who is a twin and also is on pain medicines.  That twin sister presently is on morphine sulfate.  The patient is worried  about using that at the present time.   MEDICATIONS:  1.  Hydrocodone 5/325 one tablet t.i.d. p.r.n. (approximately three per      day).  2.  Voltaren 75 mg daily.  3.  Methadone 10 mg two tablets t.i.d.  4.  Effexor 250 mg daily.  5.  Temazepam 30 mg nightly.  6.  Tegretol 200 mg t.i.d.  7.  Neurontin 600 mg q.8 h.  8.  Seroquel 100 mg twice daily.  9.  Zanaflex 2 mg t.i.d.  10. Potassium chloride 10 mEq daily.  11. Lidocaine patch 5% on 12 hours and off 12 hours daily.   PHYSICAL EXAMINATION:  Reasonably well-appearing moderately overweight adult  female in mild acute discomfort.  Blood pressure 152/93 with a pulse of 93,  respiratory rate and O2 saturation 93% on room air.  The patient has 5-/5  strength throughout the bilateral upper and lower extremities.  Bulk and  tone were normal.   IMPRESSION:  Chronic low back pain with evidence of moderate lumbar  spondylosis/degenerative disk disease.   In the office today we did refill the patient's methadone at 10 mg two  tablets p.o. t.i.d.  We also increased her hydrocodone to 7.5/325 from  5/325.  She still is allowed to take one tablet three times per day as  necessary.  We will plan on seeing her in followup in approximately 3  months' time with refill of medication prior to that appointment as  necessary.     ______________________________  Ellwood Dense, M.D.     DC/MedQ  D:  04/04/2005 14:55:40  T:  04/04/2005 19:54:17  Job #:  [id number]

## 2010-07-28 NOTE — Assessment & Plan Note (Signed)
REASON FOR VISIT:  Christina Rocha returns to clinic today for follow-up  evaluation.  She reports that overall she has been doing reasonably well.  She did have a cast removed from her left foot approximately mid December  2004.  She was subsequently admitted into [facility] for a bipolar  exacerbation approximately middle of December.  During that time she also  had repeat x-rays of her leg done and the cast was removed.  She had been  started on Depakote during that hospital admission for bipolar disorder.  She is due for follow-up with outpatient psychiatry this coming Monday.   The patient is reporting severe swelling that she is experiencing all over  her body.  She reports that she feels this is secondary to the Depakote as  no other medications have been changed.  She did apparently see her primary  care physician and they apparently did lower extremity Dopplers which was  negative.  She was told to start Lasix but has not started that.  She plans  to discuss the swelling with her psychiatrist when she sees them this coming  week.   The patient reports that she is having no particular benefit from Flexeril  medication.  Her twin sister has been started on Zanaflex 2 mg t.i.d. and  she would at least like to try that medication.  Unfortunately, her sister  has also lost her pain specialist as Dr. Lew Dawes has not been in practice.  The patient's sister is looking for other pain specialists in the area but  is presently being maintained by her primary care physician.   The patient herself needs a refill on her hydrocodone in the office today.  She also requests that we adjust her methadone medication as she is not  gaining adequate relief.   MEDICATIONS:  1. Hydrocodone 5/325 one tablet t.i.d. p.r.n.  2. Voltaren 75 mg b.i.d.  3. Flexeril 10 mg b.i.d.  4. Methadose 10 mg b.i.d.  5. Effexor 75 mg daily.  6. Temazepam 30 mg q.h.s.  7. Depakote 50 mg three tablets q.h.s.  8.  Inderal 10 mg daily.  9. Geodon 80 mg daily.  10.      Neurontin 600 mg t.i.d. (started in the hospital).  11.      Trileptal 150 mg daily.   PHYSICAL EXAMINATION:  Reasonably well-appearing adult female in a slightly  manic phase.  She is having pressured speech in the office today and I did  need to slow her down a few times to get all the details.  Blood pressure  measured at 127/75 with a pulse of 114 and O2 saturation 98% on room air.  She has strength of 5/5 throughout the bilateral upper and lower  extremities.  Bulk and tone were normal and reflexes were 2+ and  symmetrical.  There was mild erythema throughout her bilateral lower  extremities with blanching with pressure.  Mild edema was present throughout  her bilateral lower extremities.   IMPRESSION:  Chronic low back pain with evidence of moderate lumbar  spondylosis/degenerative disc disease.   At the present time we have decided to increase her methadone to 10 mg two  tablets p.o. b.i.d. which is an increase from one tablet b.i.d.  I have also  asked her to stop her Flexeril and start taking Zanaflex 2 mg one p.o.  t.i.d.  I have refilled her hydrocodone 5/325 one tablet p.o. t.i.d. in the  office today.  I have asked her to make  sure that she mentions the edema and  reaction that she may be experiencing as it may be related to the Depakote.  She is seeing the psychiatrist this coming week.  She has had Doppler  studies which were reportedly negative for deep vein thrombosis.   I will plan on seeing the patient in follow-up in approximately 1-2 months'  time with refills of medication prior to that appointment.      Ellwood Dense, M.D.   DC/MedQ  D:  04/16/2003 14:51:56  T:  04/16/2003 15:22:58  Job #:  [id number]

## 2010-07-28 NOTE — Assessment & Plan Note (Signed)
REFERRING PHYSICIAN:  Stark Klein, M.D.   Christina Rocha returns to clinic today for follow-up evaluation.  She reports  that overall she is doing well using a combination of her methadone,  hydrocodone, Zanaflex and Voltaren.  She does not need refill on her  Voltaren or Zanaflex at the present time.   The patient reports that she did see her primary care physician, Dr. Sherrill Raring  last week for knee pain.  He apparently gave her some exercises and asked  her to do some dieting.  She reports that she is having less pain this week  than last.   The patient reports that she would like to get her sister set up through  this office.  Apparently, her sister saw Dr. Stevphen Rochester in this office but they  were very unsatisfied with the care.  Nevertheless, the patient's sister  would like to come to this office for treatment as she has apparently some  of the same issues as Christina Rocha.   MEDICATIONS:  1. Hydrocodone 5/325 one tablet t.i.d. p.r.n. (two to three per day).  2. Voltaren 75 mg b.i.d.  3. Methadone 10 mg two tablets b.i.d.  4. Effexor 75 mg daily.  5. Temazepam 30 mg q.h.s.  6. Depakote 500 mg three tablets q.h.s.  7. Inderal 10 mg daily.  8. Geodon 80 mg daily.  9. Neurontin 600 mg t.i.d.  10.      Trileptal 150 mg daily.  11.      Zanaflex 2 mg t.i.d.  12.      Potassium chloride 10 mEq daily.   PHYSICAL EXAMINATION:  GENERAL APPEARANCE:  A well-appearing, slightly  overweight adult female.  VITAL SIGNS:  Blood pressure 108/71 with a pulse of 81, respiratory rate 14,  and O2 saturation 100% on room air.  NEUROLOGIC:  The patient has 5-/5 strength throughout the bilateral upper  and lower extremities.  Bulk and tone were normal. Reflexes were 2+ and  symmetrical.  She ambulates without any assistive device.   IMPRESSION:  Chronic low back pain with evidence of moderate lumbar  spondylosis/degenerative disc disease.   In the office today, I did refill the patient's methadone  and hydrocodone as  noted above as of Jul 14, 2003.  She does not need a refill on her Voltaren  or Zanaflex at this point.  I have encouraged her to be as active as  possible.  She is experiencing no major side effects and is doing well with  the medication without signs of abuse or diversion.   I will plan on seeing the patient in follow-up in approximately two months'  time.     Ellwood Dense, M.D.   DC/MedQ  D:  07/12/2003 13:04:21  T:  07/12/2003 13:58:56  Job #:  [id number]   cc:   Stark Klein, M.D.

## 2010-07-28 NOTE — H&P (Signed)
[facility]  Patient:    Christina Rocha, Christina Rocha Visit Number: [id number] MRN: [id number]          Service Type: PSY Location: 40 [id number] Attending Physician:  Denny Peon Dictated by:   Candi Leash. Orsini, N.P. Adm. Date:  11/06/2000                     Psychiatric Admission Assessment  IDENTIFYING INFORMATION:  This is a 50 year old single white female involuntarily committed for psychotic behavior on November 06, 2000.  HISTORY OF PRESENT ILLNESS:  The patient is here on commitment.  The patient was petitioned per her mother with disorganized thinking, delusional behavior. The patient feels that she is able to speak in [place] Code, grandiose and having bizarre behavior, walking in "bunny pajamas" to neighbors house, that started this past Sunday.  Mother was here for interview after patients assessment and provided other information.  The patient has been experiencing other stressors.  This is the anniversary of her sisters death.  The patients twin sister recently moved away.  The patient also has been recently started on OxyContin and feels that there may be a potential abuse of this medication.  Mother did state that patient functions well at home and cares for her niece.  She shops.  Did state that her sleeping has been decreased the past few nights.  The patient did state, during her interview, that patient is denying any hallucinations.  Dr. Lourdes Sledge was in for the session.  PAST PSYCHIATRIC HISTORY:  This is the first visit to [facility].  She was at [facility] about seven years ago.  She attends [facility] on an outpatient basis.  The patient has a history of bipolar disorder since age of 60; usually has manic episodes.  SOCIAL HISTORY:  She is a 50 year old single white female with no children. She lives with her mother.  Her mother is in the naval reserves and is to be mobilized on Sunday.  The patients twin  sister recently moved away.  Her other sister died about three years ago.  The patient has attended the [employer] of [address]  FAMILY HISTORY:  Her twin sister has problems with anxiety and depression. Other sister had problems with substance abuse.  ALCOHOL/DRUG HISTORY:  The patient smokes.  Alcohol is minimal.  No substance abuse.  PRIMARY CARE PHYSICIAN:  Dr. Louanna Raw and Dr. Lew Dawes.  MEDICAL PROBLEMS:  Fibromyalgia and a recent urinary tract infection.  Has finished up a dose of Levaquin.  MEDICATIONS:  Neurontin 100 mg, 2 b.i.d., 4 q.h.s., OxyContin 80 mg q.12h., oxycodone 5 mg q.4-6h. in between, trazodone 150 mg q.h.s., Arthrotec 75 mg b.i.d., Seroquel 25 mg, 2 in the morning and 4 at bedtime.  DRUG ALLERGIES:  HALDOL.  PHYSICAL EXAMINATION:  Performed at [facility] Emergency Department.  LABORATORY DATA:  Urine drug screen is positive for opiates.  Acetaminophen level was 1.9.  CMET was within normal limits.  Urine pregnancy test was negative.  MENTAL STATUS EXAMINATION:  She is an alert, overweight, middle-aged, white female.  She is dressed in hospital wear.  She is restless.  She is cooperative to some extent.  Speech is "word salad."  Mood is depressed and anxious.  Affect is teari-eyed and anxious.  Thought process:  The patient denies any hallucinations.  No suicidal or homicidal ideation.  Paranoid ideation present.  The patient is delusional and feeling that she is Lorrine Kin  and she is grandiose.  Cognitive function is intact.  She is aware of her person and place.  Her judgment is poor.  Insight is poor.  DIAGNOSES: Axis I:    Bipolar disorder, manic. Axis II:   Deferred. Axis III:  1. Fibromyalgia.            2. Recent urinary tract infection. Axis IV:   Moderate (problems related to psychosocial problems and grief). Axis V:    Current 30; estimated this past year 65-70.  PLAN:  Voluntary commitment to [facility] for manic behavior, decompensating.  Contract for safety.  The patient is put on close observation.  Will resume her routine medications.  Will obtain a urine and CBK.  Will increase her Seroquel.  Will contact mother for background information and to determine disposition after discharge.  We will also contact Dr. Lew Dawes in regards to OxyContin use.  We will also monitor patients food and fluid intake.  Our goal is to improve and stabilize her mood and thinking so patient could be safe and functional and to return patient to prior living arrangement.  TENTATIVE LENGTH OF STAY:  Four to five days or more depending on patients response to medications. Dictated by:   Candi Leash. Orsini, N.P. Attending Physician:  Denny Peon DD:  11/06/00 TD:  11/06/00 Job: [id number]

## 2010-07-28 NOTE — Discharge Summary (Signed)
[facility]  Patient:    Christina Rocha, Christina Rocha Visit Number: [id number] MRN: [id number]          Service Type: PSY Location: 40 [id number] Attending Physician:  Jeanice Lim Dictated by:   Jeanice Lim, M.D. Admit Date:  11/06/2000 Discharge Date: 11/20/2000                             Discharge Summary  IDENTIFYING DATA:  This is a 50 year old single Caucasian female involuntarily committed for psychotic behavior on November 06, 2000 petitioned by the mother with disorganized thinking, delusional behavior, believing she could speak in [place] Code, grandiose, having bizarre behavior, walking in [place] Hernandez pajamas to neighbors house.  Behavior started within the last week and followed multiple stressors including anniversary of her sisters death.  She had also been recently started on OxyContin and feels that may be related as well as has had difficulty sleeping for the last few nights.  Denied any auditory or visual hallucinations.  PAST PSYCHIATRIC HISTORY:  This is the first admission to [facility].  She was at [facility] seven years ago.  Attends [employer] on outpatient basis and has a history of bipolar disorder since the age of 82, usually manic episodes.  MEDICATIONS:  Neurontin, OxyContin, oxycodone for racer pain, trazodone, Arthrotec and Seroquel.  DRUG ALLERGIES:  HALDOL.  MENTAL STATUS EXAMINATION:  Alert, oriented, middle-aged, Caucasian female, somewhat restless and redirectible.  Speech was a word salad.  Mood depressed, anxious.  Affect teari-eyed, labile.  Thought process denied auditory or visual hallucinations.  Denied suicidal, homicidal or violent ideation. Admitted to paranoid ideation.  Clearly delusional and somewhat grandiose. Cognition was mildly impaired as far as short-term memory and concentration. Judgment and insight were poor.  ADMISSION DIAGNOSES: Axis I:    Bipolar disorder, manic. Axis II:    Deferred. Axis III:  1. Fibromyalgia.            2. Recent urinary tract infection. Axis IV:   Moderate (related to psychosocial problems and grief). Axis V:    30; highest in the past year 65.  PHYSICAL EXAMINATION:  Essentially within normal limits.  Vital signs stable. Afebrile.  Neurologically grossly nonfocal and intact.  LABORATORY DATA:  Routine admission labs to rule out a reversible organic etiology were obtained and were essentially within normal limits including a CBC with differential, CK, thyroid profile, valproic acid level, which was on September 5, 38.7, September 7, 89.9, September 9, 103.7, September 11, 101. Urinalysis negative.  HOSPITAL COURSE:  The patient was resumed on her medications including Seroquel with a Prolixin and Cogentin p.r.n.  She was put on one-to-one for safety initially due to degree of agitation and psychosis, somewhat unpredictable.  Neurontin was restarted and Ativan also used as a p.r.n. OxyContin was gradually decreased and Seroquel increased as well as Neurontin. Seroquel continued to be titrated and Neurontin was titrated.  The patient did require Prolixin and Ativan p.r.n. with positive partial response.  Due to familys concern and temporal history of worsening of mood stability around the time oxycodone was started as well as lack of pain, medication was gradually tapered.  Seroquel was optimized and Depakote started at 250 mg t.i.d. with monitoring of liver function tests and Depakote level.  Depakote was titrated and patient was able to come off one-to-one.  Klonopin was started for adjunctive mood stabilization and episodic anxiety and agitation. Depakote was changed to  brand name to decrease GI side effects and then changed to extended-release.  OxyContin was completely discontinued on September 9 and patient was started on Protonix and given Percocet 1 q.8h. p.r.n. pain.  The patient showed continued improvement since starting  Depakote and likely secondary to decreasing OxyContin, a long-acting opiate which appeared to interfere with her mood stability.  She reported a stable mood, had no agitation, was cooperative, pleasant, euthymic.  Affect full.  Thought process goal directed.  Thought content negative for psychotic symptoms.  No longer delusional, grandiose or paranoid.  CONDITION ON DISCHARGE:  At the time of discharge, clearly improved but not fully recovered.  She had no suicidal, homicidal or violent ideation at that time and she agreed to follow up.  DISCHARGE MEDICATIONS: 1. Klonopin 0.5 mg, 2 q.a.m., 1 at 1 p.m. and 2 q.h.s. 2. Depakote ER, 1 q.a.m. and 2 q.h.s. 3. Seroquel 200 mg q.a.m. and q.h.s. and 100 mg q.h.s., totalling 200 mg    q.a.m. and 500 mg q.h.s. 4. Neurontin 400 mg, 1 q.a.m. and 1 q.h.s.  DISCHARGE RECOMMENDATIONS:  The patient was advised not to drive secondary to sedating medications.  No restrictions on diet.  She was also advised to avoid long-acting opiates and caution with steroids since these may induce mood swings.  FOLLOW-UP:  She agreed to follow up with [facility] and had an appointment on September 12 between 9 and 11 a.m. at emergency services.  DISCHARGE DIAGNOSES: Axis I:    Bipolar disorder, manic. Axis II:   Mixed personality traits. Axis III:  1. Fibromyalgia.            2. Recent urinary tract infection. Axis IV:   Moderate (related to psychosocial problems and grief). Axis V:    55 at the time of discharge. Dictated by:   Jeanice Lim, M.D. Attending Physician:  Jeanice Lim DD:  12/25/00 TD:  12/27/00 Job: [id number]

## 2010-07-28 NOTE — Assessment & Plan Note (Signed)
Ms. Kommer returns to clinic today for follow up evaluation.  She  reports that she has had very severe pain over the past 3 weeks without  any exacerbating event.  She reports that she has substantially  increased pain and can not bend forward at all.  She would like a  adjustment in her medicines.  She also complains of pain in her SI  joints bilaterally and reports that her sister has had a steroid  injection in the past in those areas that have helped.   MEDICATIONS:  1. Hydrocodone 5/325 mg 1 tablet t.i.d. p.r.n. (approximately 3 per      day).  2. Methadone 10 mg 2 tablets q.i.d.  3. Effexor 250 mg nightly.  4. Trazodone 150 mg nightly.  5. Neurontin 600 mg t.i.d.  6. Synflex 2 mg t.i.d.  7. Potassium chloride 10 ml equivalents daily.   REVIEW OF SYSTEMS:  Positive for weight gain, constipation, nausea, poor  appetite and occasional wheezes.   PHYSICAL EXAMINATION:  A well appearing adult female in mild to moderate  acute discomfort.  Blood pressure 144/80 with a pulse of 83.  Chemistry rate 18 and O2  saturation 99% on room air.  She has 4+-5-/5 strength throughout.  She complains of pain in her SI  joints bilaterally.   IMPRESSION:  1. Chronic lower back pain with evidence of moderate lumbar      spondylosis/degenerative disk disease.  2. SI joint pain.   In the office today no refill on medications is presently necessary.  We  did make numerous adjustments.  We increased her Hydrocodone up to 1  tablet q.i.d. p.r.n.  We also increased her Methadone from 80 mg a day  up to 90 mg using 3 tablets 3 times a day.  We also increased her  Synflex up to 2 mg q.i.d.  We maintained her Neurontin at the same dose  600 mg t.i.d.   We did have the patient sign a consent for trigger injections of her SI  joint.  After she consented I prepared probably a 5 mL syringe with 3 mL  of 1% Lidocaine and 1 mL of Kenalog-40.  I cleansed each SI joint and  injected each joint with  approximately 2 mL of the above noted mixture.  The patient had good hemostasis at the injection site and tolerated the  procedure well.  Band-Aid's were placed over the injection site where  there was minimal drop of blood.   We will plan on seeing the patient at follow up in this office in  approximately 2-3 months time with refills, medications part of that  appointment as noted with the adjustments made.           ______________________________  Ellwood Dense, M.D.     DC/MedQ  D:  04/29/2006 16:04:58  T:  04/30/2006 06:41:42  Job #:  [id number]

## 2010-07-28 NOTE — Assessment & Plan Note (Signed)
Christina Rocha returns to clinic today for follow-up evaluation.  She reports  that overall, she is doing reasonably well.  She does need refills on her  methadone and hydrocodone in the office.  She is trying to look for a low  income apartment, as the place that she is living with her husband is too  expensive at present.  She has the only income at the present time, as her  husband is still looking for work.   MEDICATIONS:  1.  Hydrocodone 5/325 1 tablet t.i.d. p.r.n. (approximately three per day).  2.  Voltaren 75 mg daily.  3.  Methadone 10 mg 2 tablets t.i.d.  4.  Effexor 250 mg daily.  5.  Temazepam 30 mg q.h.s.  6.  Tegretol 200 mg t.i.d.  7.  Neurontin 600 mg q.8h.  8.  Seroquel 100 mg b.i.d.  9.  Zanaflex 2 mg t.i.d.  10. Potassium chloride 10 mEq daily.  11. Lidocaine patch 5% on 12 hours, and off 12 hours daily.   REVIEW OF SYSTEMS:  Positive for occasional night sweats and occasional  constipation with poor appetite.   PHYSICAL EXAMINATION:  VITAL SIGNS:  Blood pressure 117/83 with pulse 80,  respiratory rate 16, and oxygen saturation 100% on room air.  GENERAL:  A reasonably well-appearing, middle-aged adult female in mild  acute discomfort.  NEUROLOGIC:  She has +5/5 strength throughout.  She ambulates without any  assistive device.   IMPRESSION:  Chronic low back pain with evidence of moderate lumbar  spondylosis/degenerative disk disease.   In the office today, we did refill the patient's hydrocodone and methadone,  as noted above.  We will plan on seeing her in followup in approximately two  months time with refills prior to that appointment as necessary.           ______________________________  Ellwood Dense, M.D.     DC/MedQ  D:  08/01/2005 14:33:45  T:  08/01/2005 22:08:19  Job #:  [id number]

## 2010-07-28 NOTE — Assessment & Plan Note (Signed)
MEDICAL RECORD NUMBER:  [id number].   Christina Rocha returns to the clinic today for followup evaluation.  She reports  that she has been sick recently with stomach virus and missed an  appointment.  She does report that she still gains good relief with the  medicines noted below.  She is using her methadone 10 mg two tablets twice a  day.  She also uses hydrocodone 5/325 mg one tablet t.i.d.  Additionally she  uses Voltaren 75 mg a day and Zanaflex three times a day.  She reports that  she gets good relief with these medications.  She does need refills of all  of them in the office today.   MEDICATIONS:  1.  Hydrocodone 5/325 mg one tablet t.i.d. p.r.n. (two to three per day).  2.  Voltaren 75 mg daily.  3.  Methadone 10 mg two tablets b.i.d.  4.  Effexor 75 mg daily.  5.  Temazepam 30 mg q.h.s.  6.  Depakote 500 mg three tablets q.h.s.  7.  Inderal 10 mg daily.  8.  Geodon 80 mg daily.  9.  Neurontin 600 mg t.i.d.  10. Trileptal 150 mg daily.  11. Zanaflex 2 mg t.i.d.  12. Potassium chloride 10 mEq daily.   PHYSICAL EXAMINATION:  A well-appearing adult female with slight respiratory  cough in the office today.  The blood pressure was 12/69 with a pulse of 57,  a respiratory rate of 16 and an O2 saturation of 97% on room air.  She has 5-  to 5/5 strength throughout the bilateral upper and lower extremities.  She  ambulates without any assistive device.  Bulk and tone were normal.  Reflexes were 2+ and symmetrical.   IMPRESSION:  Chronic low back pain with evidence of moderate lumbar  spondylosis/degenerative disk disease.   In the office today, I did refill the patient's hydrocodone and methadone as  noted above.  We also dated those for January 14, 2004.  We have refilled  her Zanaflex and Voltaren in the office for today's date.  We will plan on  seeing the patient in followup in approximately three months' time with  refills prior to that appointment as necessary.       DC/MedQ  D:  01/07/2004 10:24:40  T:  01/07/2004 14:17:31  Job #:  [id number]

## 2010-07-28 NOTE — Assessment & Plan Note (Signed)
HISTORY:  Ms. Zurn returns to the clinic today for a follow-up evaluation.  She reports that her psychiatrist has recently increased her Effexor to 250  mg daily.  She has lost two cats in the past couple of months, secondary to  a reaction to their flea powder.  She continues to report good relief with  the methadone, Zanaflex, Neurontin and hydrocodone medications.  She does  not need refills on any of those.  She reports that the lidocaine patch has  been helping, and she is using it on a periodic basis.  She uses it 12 hours  on and 12 hours off as instructed.  She reports that she has also tried her  sister's TENS unit, and would like to obtain one of those for her own use,  if the insurance will pay for it.  We have told her that we will seek  approval from the insurance company.   MEDICATIONS:  1.  Hydrocodone 5/325 mg, one tab t.i.d. p.r.n. (three per day).  2.  Voltaren 75 mg, one tab daily.  3.  Methadone 10 mg, two tab b.i.d.  4.  Effexor 250 mg daily.  5.  Temazepam 30 mg q.h.s.  6.  Tegretol 200 mg t.i.d.  7.  Neurontin 600 mg q.i.d.  8.  Seroquel 100 mg b.i.d.  9.  Zanaflex 2 mg t.i.d.  10. Potassium chloride 10 mEq daily.  11. Lidocaine patch 5%, one 12 hours and off 12 hours daily p.r.n.   PHYSICAL EXAMINATION:  GENERAL:  A well-appearing slightly overweight adult  female, in mild to no acute discomfort.  VITAL SIGNS:  Blood pressure 146/86, pulse 93, respirations 16, O2  saturation 99% on room air.  NEUROLOGIC:  She has 5-/5 strength throughout the bilateral upper and lower  extremities.  Bulk and tone were normal.  Reflexes were 2+ and symmetrical.  She ambulates without any assistive device.   IMPRESSION:  Chronic low back pain with evidence of moderate lumbar  spondylosis/degenerative disk disease.   In the office today no refills were necessary.  Will seek approval for a  TENS unit from her insurance company and be in touch with her regarding  those  results. Will plan on seeing her in followup in approximately two  months' time, and will refill the pain medicines as necessary in early  August.       DC/MedQ  D:  10/04/2004 14:20:08  T:  10/04/2004 14:58:46  Job #:  [id number]
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
## 2010-07-28 NOTE — Assessment & Plan Note (Signed)
Ms. Biddy returns to the clinic today for followup evaluation.  She reports  that she is doing reasonably well overall.  She has continued to use the  hydrocodone approximately three tablets per day.  She also uses methadone 10  mg two tablets twice a day, along with the Voltaren and the Neurontin and  Zanaflex as noted below.  She reports a fair amount of pain relief overall.   The patient does complain of diffuse sweating along with a decreased thirst.  She reports that her psychiatrist has told her that her liver function tests  were somewhat abnormal and he has checked her Tegretol level.  She is due to  see her primary care physician today for these problems.   MEDICATIONS:  1.  Hydrocodone 5/325, one tablet t.i.d. p.r.n. (three per day).  2.  Voltaren 75 mg, one tablet every day.  3.  Methadone 10 mg two tablets b.i.d.  4.  Effexor 150 mg every day.  5.  Temazepam 30 mg q.h.s.  6.  Tegretol 200 mg t.i.d.  7.  Neurontin 600 mg q.i.d.  8.  Seroquel 100 mg b.i.d.  9.  Zanaflex 2 mg t.i.d.  10. Potassium chloride 10 mEq every day.   PHYSICAL EXAMINATION:  GENERAL:  A well-appearing adult female in mild acute  discomfort.  VITAL SIGNS:  Blood pressure 110/61 with a pulse of 74, respiratory rate 16,  and O2 saturation 98% on room air.  MUSCULOSKELETAL:  She has 5-/5 strength throughout the bilateral upper and  lower extremities.  Bulk and tone were normal and reflexes were 2+ and  symmetrical.   IMPRESSION:  Chronic low back pain with evidence of moderate lumbar  spondylosis/degenerative disk disease.   In the office today, the patient is doing reasonably well overall.   1.  We did refill her Neurontin, Zanaflex, Methadone, and hydrocodone.  2.  We will plan on seeing the patient in followup in approximately three      months' time with refills prior to that appointment as necessary.      DC/MedQ  D:  07/05/2004 15:38:15  T:  07/05/2004 17:12:14  Job #:  [id number]

## 2010-12-04 LAB — URINALYSIS, ROUTINE W REFLEX MICROSCOPIC
Bilirubin Urine: NEGATIVE
Bilirubin Urine: NEGATIVE
Glucose, UA: 100 — AB
Ketones, ur: NEGATIVE
Ketones, ur: NEGATIVE
Leukocytes, UA: NEGATIVE
Nitrite: NEGATIVE
Nitrite: NEGATIVE
Specific Gravity, Urine: 1.007
Specific Gravity, Urine: 1.008
Urobilinogen, UA: 0.2
Urobilinogen, UA: 1
pH: 6.5
pH: 7

## 2010-12-04 LAB — COMPREHENSIVE METABOLIC PANEL
ALT: 2342 — ABNORMAL HIGH
ALT: 1510 — ABNORMAL HIGH
AST: 303 — ABNORMAL HIGH
Albumin: 2.9 — ABNORMAL LOW
Alkaline Phosphatase: 82
Alkaline Phosphatase: 84
BUN: 76 — ABNORMAL HIGH
BUN: 102 — ABNORMAL HIGH
CO2: 19
CO2: 19
Calcium: 7.6 — ABNORMAL LOW
Calcium: 7.4 — ABNORMAL LOW
Chloride: 84 — ABNORMAL LOW
Creatinine, Ser: 11.62 — ABNORMAL HIGH
GFR calc Af Amer: 4 — ABNORMAL LOW
GFR calc non Af Amer: 3 — ABNORMAL LOW
GFR calc non Af Amer: 3 — ABNORMAL LOW
Glucose, Bld: 174 — ABNORMAL HIGH
Glucose, Bld: 94
Potassium: 4.4
Sodium: 122 — ABNORMAL LOW
Sodium: 124 — ABNORMAL LOW
Total Bilirubin: 1.3 — ABNORMAL HIGH
Total Protein: 5.8 — ABNORMAL LOW

## 2010-12-04 LAB — RENAL FUNCTION PANEL
Albumin: 2.9 — ABNORMAL LOW
Albumin: 2.9 — ABNORMAL LOW
Albumin: 3 — ABNORMAL LOW
Albumin: 3.1 — ABNORMAL LOW
Albumin: 3.1 — ABNORMAL LOW
Albumin: 3.1 — ABNORMAL LOW
Albumin: 3.2 — ABNORMAL LOW
Albumin: 3.2 — ABNORMAL LOW
Albumin: 3.2 — ABNORMAL LOW
BUN: 28 — ABNORMAL HIGH
BUN: 35 — ABNORMAL HIGH
BUN: 79 — ABNORMAL HIGH
BUN: 98 — ABNORMAL HIGH
CO2: 19
CO2: 28
CO2: 28
CO2: 29
Calcium: 7.2 — ABNORMAL LOW
Calcium: 7.6 — ABNORMAL LOW
Calcium: 7.8 — ABNORMAL LOW
Calcium: 7.9 — ABNORMAL LOW
Calcium: 7.9 — ABNORMAL LOW
Calcium: 8 — ABNORMAL LOW
Calcium: 8.1 — ABNORMAL LOW
Calcium: 9
Calcium: 9
Chloride: 102
Chloride: 103
Chloride: 94 — ABNORMAL LOW
Creatinine, Ser: 13.68 — ABNORMAL HIGH
Creatinine, Ser: 2.93 — ABNORMAL HIGH
Creatinine, Ser: 3.04 — ABNORMAL HIGH
Creatinine, Ser: 3.59 — ABNORMAL HIGH
Creatinine, Ser: 5.37 — ABNORMAL HIGH
Creatinine, Ser: 5.43 — ABNORMAL HIGH
Creatinine, Ser: 8.4 — ABNORMAL HIGH
GFR calc Af Amer: 10 — ABNORMAL LOW
GFR calc Af Amer: 10 — ABNORMAL LOW
GFR calc Af Amer: 11 — ABNORMAL LOW
GFR calc Af Amer: 17 — ABNORMAL LOW
GFR calc Af Amer: 18 — ABNORMAL LOW
GFR calc Af Amer: 20 — ABNORMAL LOW
GFR calc Af Amer: 21 — ABNORMAL LOW
GFR calc Af Amer: 4 — ABNORMAL LOW
GFR calc Af Amer: 6 — ABNORMAL LOW
GFR calc non Af Amer: 14 — ABNORMAL LOW
GFR calc non Af Amer: 15 — ABNORMAL LOW
GFR calc non Af Amer: 17 — ABNORMAL LOW
GFR calc non Af Amer: 17 — ABNORMAL LOW
GFR calc non Af Amer: 21 — ABNORMAL LOW
GFR calc non Af Amer: 3 — ABNORMAL LOW
GFR calc non Af Amer: 5 — ABNORMAL LOW
GFR calc non Af Amer: 8 — ABNORMAL LOW
GFR calc non Af Amer: 9 — ABNORMAL LOW
GFR calc non Af Amer: 9 — ABNORMAL LOW
Glucose, Bld: 130 — ABNORMAL HIGH
Glucose, Bld: 139 — ABNORMAL HIGH
Glucose, Bld: 186 — ABNORMAL HIGH
Phosphorus: 2.3
Phosphorus: 4.1
Phosphorus: 4.2
Phosphorus: 4.4
Phosphorus: 4.6
Phosphorus: 4.7 — ABNORMAL HIGH
Phosphorus: 5.3 — ABNORMAL HIGH
Phosphorus: 5.5 — ABNORMAL HIGH
Phosphorus: 6.8 — ABNORMAL HIGH
Potassium: 3.1 — ABNORMAL LOW
Potassium: 3.5
Potassium: 4
Potassium: 4
Potassium: 4.2
Sodium: 122 — ABNORMAL LOW
Sodium: 129 — ABNORMAL LOW
Sodium: 133 — ABNORMAL LOW
Sodium: 133 — ABNORMAL LOW
Sodium: 135
Sodium: 141
Sodium: 142

## 2010-12-04 LAB — CBC
HCT: 28.8 — ABNORMAL LOW
HCT: 31.1 — ABNORMAL LOW
HCT: 31.1 — ABNORMAL LOW
HCT: 33.5 — ABNORMAL LOW
HCT: 33.7 — ABNORMAL LOW
HCT: 33.9 — ABNORMAL LOW
HCT: 31.4 — ABNORMAL LOW
Hemoglobin: 10.7 — ABNORMAL LOW
Hemoglobin: 10.8 — ABNORMAL LOW
Hemoglobin: 11.3 — ABNORMAL LOW
Hemoglobin: 11.5 — ABNORMAL LOW
Hemoglobin: 12.4
Hemoglobin: 10.8 — ABNORMAL LOW
MCHC: 33.3
MCHC: 33.5
MCHC: 33.8
MCHC: 34.1
MCHC: 34.2
MCHC: 34.3
MCHC: 34.6
MCHC: 34.4
MCV: 89
MCV: 89.3
MCV: 90.1
MCV: 90.2
MCV: 90.3
Platelets: 117 — ABNORMAL LOW
Platelets: 138 — ABNORMAL LOW
Platelets: 140 — ABNORMAL LOW
Platelets: 142 — ABNORMAL LOW
Platelets: 145 — ABNORMAL LOW
Platelets: 169
Platelets: 180
Platelets: 251
RBC: 4.06
RBC: 3.48 — ABNORMAL LOW
RBC: 3.72 — ABNORMAL LOW
RBC: 3.76 — ABNORMAL LOW
RBC: 4.12
RBC: 3.53 — ABNORMAL LOW
RDW: 12.8
RDW: 13.2
RDW: 13.2
RDW: 13.3
RDW: 13.3
RDW: 13.4
RDW: 13.4
RDW: 13.6
RDW: 13.7
RDW: 13.9
WBC: 6.5
WBC: 12.6 — ABNORMAL HIGH
WBC: 14.4 — ABNORMAL HIGH
WBC: 17.3 — ABNORMAL HIGH
WBC: 7.5
WBC: 9

## 2010-12-04 LAB — PROTEIN ELECTROPHORESIS, SERUM
Alpha-1-Globulin: 7.1 — ABNORMAL HIGH
Alpha-2-Globulin: 9.7
Gamma Globulin: 17.3
Total Protein ELP: 5.9 — ABNORMAL LOW

## 2010-12-04 LAB — FERRITIN: Ferritin: 374 — ABNORMAL HIGH (ref 10–291)

## 2010-12-04 LAB — LIPASE, BLOOD: Lipase: 23

## 2010-12-04 LAB — RETICULOCYTES
RBC.: 3.84 — ABNORMAL LOW
Retic Ct Pct: 2.2

## 2010-12-04 LAB — URINE MICROSCOPIC-ADD ON

## 2010-12-04 LAB — BASIC METABOLIC PANEL
BUN: 102 — ABNORMAL HIGH
CO2: 21
Calcium: 7.4 — ABNORMAL LOW
Creatinine, Ser: 12.5 — ABNORMAL HIGH
GFR calc Af Amer: 4 — ABNORMAL LOW
Glucose, Bld: 104 — ABNORMAL HIGH

## 2010-12-04 LAB — PHOSPHORUS: Phosphorus: 8.1 — ABNORMAL HIGH

## 2010-12-04 LAB — PTH, INTACT AND CALCIUM: PTH: 454.7 — ABNORMAL HIGH

## 2010-12-04 LAB — HEPATITIS PANEL, ACUTE
HCV Ab: NEGATIVE
Hep A IgM: NEGATIVE

## 2010-12-04 LAB — UIFE/LIGHT CHAINS/TP QN, 24-HR UR
Alpha 1, Urine: DETECTED — AB
Free Kappa Lt Chains,Ur: 2.2 — ABNORMAL HIGH (ref 0.04–1.51)
Free Lambda Lt Chains,Ur: 0.59 (ref 0.08–1.01)

## 2010-12-04 LAB — GAMMA GT: GGT: 121 — ABNORMAL HIGH

## 2010-12-04 LAB — CK TOTAL AND CKMB (NOT AT ARMC)
CK, MB: 4.6 — ABNORMAL HIGH
Relative Index: 2.4

## 2010-12-04 LAB — ANTI-NEUTROPHIL ANTIBODY: Cytoplasmic Neutrophilic Ab: <1:20 {titer}

## 2010-12-04 LAB — PROTIME-INR
INR: 0.9
Prothrombin Time: 12.7
Prothrombin Time: 15

## 2010-12-04 LAB — FOLATE: Folate: >20

## 2010-12-04 LAB — DIFFERENTIAL
Basophils Absolute: 0
Eosinophils Absolute: 0.3
Eosinophils Relative: 4

## 2010-12-04 LAB — HEPATITIS C ANTIBODY: HCV Ab: NEGATIVE

## 2010-12-04 LAB — CRYOGLOBULIN

## 2010-12-04 LAB — URINE CULTURE
Colony Count: 65000
Special Requests: NEGATIVE

## 2010-12-04 LAB — HEPATITIS B SURFACE ANTIGEN: Hepatitis B Surface Ag: NEGATIVE

## 2010-12-04 LAB — CARDIAC PANEL(CRET KIN+CKTOT+MB+TROPI)
CK, MB: 3.6
Relative Index: 2.5
Troponin I: 0.05

## 2010-12-04 LAB — TSH: TSH: 0.371

## 2010-12-04 LAB — ALDOSTERONE + RENIN ACTIVITY W/ RATIO: Aldosterone/Renin Activity, Ratio: 15 Ratio (ref 1.5–18.2)

## 2010-12-04 LAB — BLEEDING TIME: Bleeding Time: 6

## 2010-12-04 LAB — ANA: Anti Nuclear Antibody(ANA): NEGATIVE

## 2010-12-04 LAB — PROTEIN / CREATININE RATIO, URINE: Total Protein, Urine: 35

## 2010-12-12 LAB — CBC
HCT: 45.6
Hemoglobin: 15.2 — ABNORMAL HIGH
MCV: 91.4
RBC: 4.99
WBC: 11.1 — ABNORMAL HIGH

## 2010-12-12 LAB — DIFFERENTIAL
Basophils Absolute: 0.1
Eosinophils Relative: 0
Lymphocytes Relative: 19
Neutro Abs: 8.3 — ABNORMAL HIGH

## 2010-12-12 LAB — COMPREHENSIVE METABOLIC PANEL
AST: 20
BUN: 9
CO2: 25
Chloride: 102
Creatinine, Ser: 0.84
GFR calc non Af Amer: >60
Total Bilirubin: 0.6

## 2010-12-12 LAB — URINALYSIS, ROUTINE W REFLEX MICROSCOPIC
Bilirubin Urine: NEGATIVE
Hgb urine dipstick: NEGATIVE
Protein, ur: 30 — AB
Urobilinogen, UA: 1

## 2010-12-12 LAB — LIPASE, BLOOD: Lipase: 23
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
## 2011-03-29 ENCOUNTER — Encounter (HOSPITAL_COMMUNITY): Payer: Self-pay | Admitting: *Deleted

## 2011-03-29 ENCOUNTER — Emergency Department (HOSPITAL_COMMUNITY)
Admission: EM | Admit: 2011-03-29 | Discharge: 2011-03-29 | Disposition: A | Discharge status: Home / Self Care | Payer: Medicare Other | Attending: Emergency Medicine | Admitting: Emergency Medicine

## 2011-03-29 DIAGNOSIS — M129 Arthropathy, unspecified: Secondary | ICD-10-CM | POA: Insufficient documentation

## 2011-03-29 DIAGNOSIS — K625 Hemorrhage of anus and rectum: Secondary | ICD-10-CM | POA: Insufficient documentation

## 2011-03-29 DIAGNOSIS — K5641 Fecal impaction: Secondary | ICD-10-CM | POA: Insufficient documentation

## 2011-03-29 DIAGNOSIS — R112 Nausea with vomiting, unspecified: Secondary | ICD-10-CM | POA: Insufficient documentation

## 2011-03-29 DIAGNOSIS — K5900 Constipation, unspecified: Secondary | ICD-10-CM | POA: Insufficient documentation

## 2011-03-29 DIAGNOSIS — F319 Bipolar disorder, unspecified: Secondary | ICD-10-CM | POA: Insufficient documentation

## 2011-03-29 DIAGNOSIS — Z79899 Other long term (current) drug therapy: Secondary | ICD-10-CM | POA: Insufficient documentation

## 2011-03-29 DIAGNOSIS — R040 Epistaxis: Secondary | ICD-10-CM | POA: Insufficient documentation

## 2011-03-29 DIAGNOSIS — R109 Unspecified abdominal pain: Secondary | ICD-10-CM | POA: Insufficient documentation

## 2011-03-29 HISTORY — DX: Unspecified osteoarthritis, unspecified site: M19.90

## 2011-03-29 HISTORY — DX: Bipolar disorder, unspecified: F31.9

## 2011-03-29 LAB — BASIC METABOLIC PANEL
GFR calc Af Amer: >90 mL/min (ref >90)
GFR calc non Af Amer: 81 mL/min — ABNORMAL LOW (ref >90)
Glucose, Bld: 117 mg/dL — ABNORMAL HIGH (ref 70–99)
Potassium: 3.7 mEq/L (ref 3.5–5.1)
Sodium: 135 mEq/L (ref 135–145)

## 2011-03-29 LAB — CBC
Hemoglobin: 14.4 g/dL (ref 12.0–15.0)
MCHC: 35 g/dL (ref 30.0–36.0)
Platelets: 376 10*3/uL (ref 150–400)

## 2011-03-29 NOTE — ED Notes (Signed)
Pt c/o constipation over the past few days, took stool softener, had bowel movement with bleeding today that was bright red in color, pt also states that she began to experience a nosebleed while straining to have bowel movement, pt also states that she "threw up' and had blood in her emesis while she had the nose bleed, pt c/o cramping to abd area.

## 2011-03-29 NOTE — ED Notes (Signed)
Into room to evaluate. MD also in room to evaluate. Patient states she has been constipated for the past month. Took two laxatives and four stool softeners last night. States lately she has been having to pull out bowel movements. States tonight she had a blood bowel movement and started to vomit. States she laid down and started having a nose bleed. Went back and sat on the toilet and began vomiting blood while nose was still bleeding. Active bowel sounds in all fields. Cramps in lower quads that comes and goes. Nothing makes it better or worse. Abdomen soft and nontender. Rectal exam preformed by MD. Patient tolerated well. Call bell and family at bedside.

## 2011-03-29 NOTE — ED Notes (Signed)
Patient discharged by MD. Did not stay in ED to sign out of ED without discharge papers.

## 2011-03-29 NOTE — ED Notes (Signed)
Resting comfortably in bed. No distress. No cramping at this time. Call bell and family at bedside. Denies any needs.

## 2011-03-29 NOTE — ED Provider Notes (Signed)
History     CSN: [id number]  Arrival date & time 03/29/11  2006   First MD Initiated Contact with Patient 03/29/11 2018      Chief Complaint  Patient presents with  . Constipation  . Rectal Bleeding  . Epistaxis     Patient is a 51 y.o. female presenting with constipation. The history is provided by the patient.  Constipation  Episode onset: several weeks ago. The onset was gradual. The problem occurs frequently. The problem has been gradually worsening. The pain is mild. The stool is described as hard and mixed with blood. Associated symptoms comments: abd cramping  Epistaxis Nausea .   Pt reports she has been constipated for weeks, and has been trying stool softeners/laxatives She reports today she had a hard bowel movement and noticed blood mixed in stool (no melena) She reports then she had a right side nose bleed, and soon after that started to vomit and noticed blood in vomit after epistaxis  She is now improved No syncope Reports abd cramping +nausea Denies any recent vaginal bleeding No h/o serious GI bleed in past Denies h/o liver disease She does use NSAIDs frequently for chronic pain  Past Medical History  Diagnosis Date  . Bipolar 1 disorder   . Arthritis     History reviewed. No pertinent past surgical history.  No family history on file.  History  Substance Use Topics  . Smoking status: Current Some Day Smoker  . Smokeless tobacco: Not on file  . Alcohol Use: Yes     occasional glass of wine 1-2 glasses per month     OB History    Grav Para Term Preterm Abortions TAB SAB Ect Mult Living                  Review of Systems  Gastrointestinal: Positive for constipation.  All other systems reviewed and are negative.    Allergies  Erythromycin  Home Medications   Current Outpatient Rx  Name Route Sig Dispense Refill  . HYDROCODONE-ACETAMINOPHEN 5-500 MG PO TABS Oral Take 1 tablet by mouth as needed. For pain    . QUETIAPINE FUMARATE 50  MG PO TABS Oral Take 100 mg by mouth at bedtime.    . TRAZODONE HCL 150 MG PO TABS Oral Take 450 mg by mouth at bedtime.      BP 108/69  Pulse 77  Temp(Src) 97.3 F (36.3 C) (Oral)  Resp 20  Ht 5\' 4"  (1.626 m)  Wt 265 lb (120.203 kg)  BMI 45.49 kg/m2  SpO2 100%  Physical Exam CONSTITUTIONAL: Well developed/well nourished HEAD AND FACE: Normocephalic/atraumatic EYES: EOMI/PERRL, conjunctiva pink ENMT: Mucous membranes moist, there is no active epistaxis.  No blood in oropharynx NECK: supple no meningeal signs SPINE:entire spine nontender CV: S1/S2 noted, no murmurs/rubs/gallops noted LUNGS: Lungs are clear to auscultation bilaterally, no apparent distress ABDOMEN: soft, nontender, no rebound or guarding Rectal - stool color normal, small amt of fecal impaction that was removed.  No external hemorrhoids noted.  The stool has a small tint of blood.  No melena noted Anal tone intact No rectal mass/abscess noted.  No significant rectal tenderness noted Chaperone present GU:no cva tenderness NEURO: Pt is awake/alert, moves all extremitiesx4 EXTREMITIES: pulses normal, full ROM SKIN: warm, color normal PSYCH: no abnormalities of mood noted  ED Course  Procedures    Labs Reviewed  CBC  BASIC METABOLIC PANEL  POCT OCCULT BLOOD STOOL, DEVICE    9:02 PM Pt well appearing  Doubt acute GI bleed at this time suspcion for acute process is low  9:25 PM Pt improved, no complaints Denies any dizziness upon standing She is comfortable with d/c home Advised to avoid NSAIDs Discussed strict return precautions   MDM  Nursing notes reviewed and considered in documentation Previous records reviewed and considered All labs/vitals reviewed and considered         Joya Gaskins, MD 03/29/11 [id number]

## 2011-03-29 NOTE — ED Notes (Signed)
Lab at bedside to draw blood.

## 2011-03-30 LAB — OCCULT BLOOD, POC DEVICE: Fecal Occult Bld: POSITIVE

## 2012-06-26 ENCOUNTER — Encounter: Payer: Self-pay | Admitting: Obstetrics and Gynecology

## 2012-07-10 ENCOUNTER — Encounter: Payer: Self-pay | Admitting: Obstetrics & Gynecology

## 2012-08-13 ENCOUNTER — Encounter: Payer: Self-pay | Admitting: Obstetrics & Gynecology

## 2012-09-15 ENCOUNTER — Encounter: Payer: Self-pay | Admitting: Obstetrics & Gynecology

## 2012-10-21 ENCOUNTER — Encounter: Payer: Self-pay | Admitting: Obstetrics & Gynecology

## 2013-01-05 ENCOUNTER — Encounter: Payer: Self-pay | Admitting: Obstetrics & Gynecology

## 2013-02-09 ENCOUNTER — Encounter (INDEPENDENT_AMBULATORY_CARE_PROVIDER_SITE_OTHER): Payer: Self-pay

## 2013-02-09 ENCOUNTER — Ambulatory Visit (INDEPENDENT_AMBULATORY_CARE_PROVIDER_SITE_OTHER): Payer: Medicare Other | Admitting: Obstetrics & Gynecology

## 2013-02-09 ENCOUNTER — Encounter: Payer: Self-pay | Admitting: Obstetrics & Gynecology

## 2013-02-09 ENCOUNTER — Other Ambulatory Visit (HOSPITAL_COMMUNITY)
Admission: RE | Admit: 2013-02-09 | Discharge: 2013-02-09 | Disposition: A | Discharge status: Home / Self Care | Payer: Medicare Other | Source: Ambulatory Visit | Attending: Obstetrics & Gynecology | Admitting: Obstetrics & Gynecology

## 2013-02-09 VITALS — BP 112/70 | Ht 65.0 in | Wt 248.0 lb

## 2013-02-09 DIAGNOSIS — Z1151 Encounter for screening for human papillomavirus (HPV): Secondary | ICD-10-CM | POA: Insufficient documentation

## 2013-02-09 DIAGNOSIS — Z1212 Encounter for screening for malignant neoplasm of rectum: Secondary | ICD-10-CM

## 2013-02-09 DIAGNOSIS — M1990 Unspecified osteoarthritis, unspecified site: Secondary | ICD-10-CM

## 2013-02-09 DIAGNOSIS — M797 Fibromyalgia: Secondary | ICD-10-CM

## 2013-02-09 DIAGNOSIS — Z124 Encounter for screening for malignant neoplasm of cervix: Secondary | ICD-10-CM

## 2013-02-09 DIAGNOSIS — Z01419 Encounter for gynecological examination (general) (routine) without abnormal findings: Secondary | ICD-10-CM | POA: Insufficient documentation

## 2013-02-09 DIAGNOSIS — F3181 Bipolar II disorder: Secondary | ICD-10-CM | POA: Insufficient documentation

## 2013-02-09 NOTE — Progress Notes (Signed)
Patient ID: Christina Rocha, female   DOB: Apr 10, 1960, 52 y.o.   MRN: [id number] Subjective:     Christina Rocha is a 52 y.o. female here for a routine exam.  No LMP recorded. Patient is postmenopausal. No obstetric history on file. Birth Control Method:  na Menstrual Calendar(currently): no periods x 12 months  Current complaints: no gyn complaints.   Current acute medical issues:  No acute only chronic   Recent Gynecologic History No LMP recorded. Patient is postmenopausal. Last Pap: years,  normal Last mammogram: never,  na  Past Medical History  Diagnosis Date  . Bipolar 1 disorder   . Arthritis     Past Surgical History  Procedure Laterality Date  . Tubal ligation      OB History   Grav Para Term Preterm Abortions TAB SAB Ect Mult Living                  History   Social History  . Marital Status: Single    Spouse Name: N/A    Number of Children: N/A  . Years of Education: N/A   Social History Main Topics  . Smoking status: Current Some Day Smoker  . Smokeless tobacco: None  . Alcohol Use: Yes     Comment: occasional glass of wine 1-2 glasses per month   . Drug Use: No  . Sexual Activity: None   Other Topics Concern  . None   Social History Narrative  . None    History reviewed. No pertinent family history.   Review of Systems  Review of Systems  Constitutional: Negative for fever, chills, weight loss, malaise/fatigue and diaphoresis.  HENT: Negative for hearing loss, ear pain, nosebleeds, congestion, sore throat, neck pain, tinnitus and ear discharge.   Eyes: Negative for blurred vision, double vision, photophobia, pain, discharge and redness.  Respiratory: Negative for cough, hemoptysis, sputum production, shortness of breath, wheezing and stridor.   Cardiovascular: Negative for chest pain, palpitations, orthopnea, claudication, leg swelling and PND.  Gastrointestinal: negative for abdominal pain. Negative for heartburn, nausea, vomiting,  diarrhea, constipation, blood in stool and melena.  Genitourinary: Negative for dysuria, urgency, frequency, hematuria and flank pain.  Musculoskeletal: Negative for myalgias, back pain, joint pain and falls.  Skin: Negative for itching and rash.  Neurological: Negative for dizziness, tingling, tremors, sensory change, speech change, focal weakness, seizures, loss of consciousness, weakness and headaches.  Endo/Heme/Allergies: Negative for environmental allergies and polydipsia. Does not bruise/bleed easily.  Psychiatric/Behavioral: Negative for depression, suicidal ideas, hallucinations, memory loss and substance abuse. The patient is not nervous/anxious and does not have insomnia.        Objective:    Physical Exam  Vitals reviewed. Constitutional: She is oriented to person, place, and time. She appears well-developed and well-nourished.  HENT:  Head: Normocephalic and atraumatic.        Right Ear: External ear normal.  Left Ear: External ear normal.  Nose: Nose normal.  Mouth/Throat: Oropharynx is clear and moist.  Eyes: Conjunctivae and EOM are normal. Pupils are equal, round, and reactive to light. Right eye exhibits no discharge. Left eye exhibits no discharge. No scleral icterus.  Neck: Normal range of motion. Neck supple. No tracheal deviation present. No thyromegaly present.  Cardiovascular: Normal rate, regular rhythm, normal heart sounds and intact distal pulses.  Exam reveals no gallop and no friction rub.   No murmur heard. Respiratory: Effort normal and breath sounds normal. No respiratory distress. She has no wheezes. She has no rales.  She exhibits no tenderness.  GI: Soft. Bowel sounds are normal. She exhibits no distension and no mass. There is no tenderness. There is no rebound and no guarding.  Genitourinary:  Breasts no masses skin changes or nipple changes bilaterally      Vulva is normal without lesions Vagina is pink moist without discharge Cervix normal in  appearance and pap is done Uterus is normal size shape and contour Adnexa is negative with normal sized ovaries    Musculoskeletal: Normal range of motion. She exhibits no edema and no tenderness.  Neurological: She is alert and oriented to person, place, and time. She has normal reflexes. She displays normal reflexes. No cranial nerve deficit. She exhibits normal muscle tone. Coordination normal.  Skin: Skin is warm and dry. No rash noted. No erythema. No pallor.  Psychiatric: She has a normal mood and affect. Her behavior is normal. Judgment and thought content normal.       Assessment:   Normal yearly gynecologic exam  Plan:    Mammogram ordered. Follow up in: 1 year.

## 2013-12-28 ENCOUNTER — Other Ambulatory Visit: Payer: Self-pay

## 2013-12-28 DIAGNOSIS — Z1231 Encounter for screening mammogram for malignant neoplasm of breast: Secondary | ICD-10-CM

## 2013-12-30 ENCOUNTER — Ambulatory Visit
Admission: RE | Admit: 2013-12-30 | Discharge: 2013-12-30 | Disposition: A | Discharge status: Home / Self Care | Payer: Medicare Other | Source: Ambulatory Visit

## 2013-12-30 DIAGNOSIS — Z1231 Encounter for screening mammogram for malignant neoplasm of breast: Secondary | ICD-10-CM

## 2014-01-04 ENCOUNTER — Other Ambulatory Visit: Payer: Self-pay | Admitting: Internal Medicine

## 2014-01-04 DIAGNOSIS — R928 Other abnormal and inconclusive findings on diagnostic imaging of breast: Secondary | ICD-10-CM

## 2014-01-27 ENCOUNTER — Other Ambulatory Visit: Payer: Medicare Other

## 2014-02-12 ENCOUNTER — Ambulatory Visit
Admission: RE | Admit: 2014-02-12 | Discharge: 2014-02-12 | Disposition: A | Discharge status: Home / Self Care | Payer: Medicare Other | Source: Ambulatory Visit | Attending: Internal Medicine | Admitting: Internal Medicine

## 2014-02-12 DIAGNOSIS — R928 Other abnormal and inconclusive findings on diagnostic imaging of breast: Secondary | ICD-10-CM

## 2014-06-24 DIAGNOSIS — J019 Acute sinusitis, unspecified: Secondary | ICD-10-CM | POA: Diagnosis not present

## 2014-08-18 ENCOUNTER — Other Ambulatory Visit: Payer: Self-pay | Admitting: Internal Medicine

## 2014-08-18 DIAGNOSIS — N63 Unspecified lump in unspecified breast: Secondary | ICD-10-CM

## 2014-08-25 DIAGNOSIS — G894 Chronic pain syndrome: Secondary | ICD-10-CM | POA: Diagnosis not present

## 2014-08-25 DIAGNOSIS — F909 Attention-deficit hyperactivity disorder, unspecified type: Secondary | ICD-10-CM | POA: Diagnosis not present

## 2014-08-26 ENCOUNTER — Ambulatory Visit
Admission: RE | Admit: 2014-08-26 | Discharge: 2014-08-26 | Disposition: A | Discharge status: Home / Self Care | Payer: Medicare Other | Source: Ambulatory Visit | Attending: Internal Medicine | Admitting: Internal Medicine

## 2014-08-26 DIAGNOSIS — N63 Unspecified lump in unspecified breast: Secondary | ICD-10-CM

## 2014-09-24 DIAGNOSIS — G894 Chronic pain syndrome: Secondary | ICD-10-CM | POA: Diagnosis not present

## 2014-09-24 DIAGNOSIS — F909 Attention-deficit hyperactivity disorder, unspecified type: Secondary | ICD-10-CM | POA: Diagnosis not present

## 2014-09-27 ENCOUNTER — Ambulatory Visit (INDEPENDENT_AMBULATORY_CARE_PROVIDER_SITE_OTHER): Payer: Medicare Other | Admitting: Physician Assistant

## 2014-09-27 VITALS — BP 130/86 | HR 81 | Temp 97.8°F | Resp 18 | Ht 64.0 in | Wt 229.0 lb

## 2014-09-27 DIAGNOSIS — Z23 Encounter for immunization: Secondary | ICD-10-CM

## 2014-09-27 NOTE — Patient Instructions (Signed)
You received the first Hep A vaccine today. Please return in 6-12 months for the 2nd vaccination which is the final one.   Hepatitis A Vaccine, Inactivated suspension for injection What is this medicine? HEPATITIS A VACCINE (hep uh TAHY tis A VAK seen) is a vaccine to protect from an infection with the hepatitis A virus. This vaccine does not contain the live virus. It will not cause a hepatitis infection. This vaccine is also used with immunoglobulin to prevent infection in people who have been exposed to hepatitis A. This medicine may be used for other purposes; ask your health care provider or pharmacist if you have questions. COMMON BRAND NAME(S): Havrix, Vaqta What should I tell my health care provider before I take this medicine? They need to know if you have any of these conditions: -bleeding disorder -fever or infection -heart disease -immune system problems -an unusual or allergic reaction to hepatitis A vaccine, latex, neomycin, other medicines, foods, dyes, or preservatives -pregnant or trying to get pregnant -breast-feeding How should I use this medicine? This vaccine is for injection into a muscle. It is given by a health care professional. A copy of Vaccine Information Statements will be given before each vaccination. Read this sheet carefully each time. The sheet may change frequently. Talk to your pediatrician regarding the use of this medicine in children. While this drug may be prescribed for children as young as 7712 months of age for selected conditions, precautions do apply. Overdosage: If you think you have taken too much of this medicine contact a poison control center or emergency room at once. NOTE: This medicine is only for you. Do not share this medicine with others. What if I miss a dose? This does not apply. What may interact with this medicine? -medicines to treat cancer -medicines that suppress your immune function like adalimumab, anakinra, etanercept,  infliximab -steroid medicines like prednisone or cortisone This list may not describe all possible interactions. Give your health care provider a list of all the medicines, herbs, non-prescription drugs, or dietary supplements you use. Also tell them if you smoke, drink alcohol, or use illegal drugs. Some items may interact with your medicine. What should I watch for while using this medicine? See your health care provider for a booster shot of this vaccine as directed. Tell your doctor right away if you have any serious or unusual side effects after getting this vaccine. You will not have protection from the hepatitis A virus for at least 8 to 10 days after your first injection. The length of time you will have protection from hepatitis A virus infection is not known. Check with your doctor if you have questions about your immunity. See your doctor before you travel out of the country. What side effects may I notice from receiving this medicine? Side effects that you should report to your doctor or health care professional as soon as possible: -allergic reactions like skin rash, itching or hives, swelling of the face, lips, or tongue -breathing problems -seizures -yellowing of the eyes or skin Side effects that usually do not require medical attention (report to your doctor or health care professional if they continue or are bothersome): -diarrhea -fever -loss of appetite -muscle pain -nausea -pain, redness, swelling or irritation at site where injected -tiredness This list may not describe all possible side effects. Call your doctor for medical advice about side effects. You may report side effects to FDA at 1-800-FDA-1088. Where should I keep my medicine? This drug is given  in a hospital or clinic and will not be stored at home. NOTE: This sheet is a summary. It may not cover all possible information. If you have questions about this medicine, talk to your doctor, pharmacist, or health care  provider.  2015, Elsevier/Gold Standard. (2013-06-29 13:19:40)

## 2014-09-27 NOTE — Progress Notes (Signed)
   Subjective:    Patient ID: Christina Rocha, female    DOB: 1960-08-30, 54 y.o.   MRN: [id number]  Chief Complaint  Patient presents with  . Hepatitis A    pt states that she is going to [place] aug[place]th    Medications, allergies, past medical history, surgical history, family history, social history and problem list reviewed and updated.  HPI  [age] yof presents needing Hep A vaccination.  [place] be heading to [place] next month. Has never received Hep A vaccine before that she is aware of. She is aware she will need 2nd dose in 6-12 months. Denies fevers, chills.   Review of Systems See HPI.     Objective:   Physical Exam  Constitutional: She is oriented to person, place, and time.  BP 130/86 mmHg  Pulse 81  Temp(Src) 97.8 F (36.6 C) (Oral)  Resp 18  Ht 5\' 4"  (1.626 m)  Wt 229 lb (103.874 kg)  BMI 39.29 kg/m2  SpO2 98%   Neurological: She is alert and oriented to person, place, and time.  Psychiatric: She has a normal mood and affect. Her speech is normal and behavior is normal.      Assessment & Plan:   Need for prophylactic vaccination and inoculation against viral hepatitis - Plan: Hepatitis A vaccine adult IM --1st dose given today, tc 6-12 months for 2nd vaccination  Donnajean Lopesodd M. Digna Countess, PA-C Physician Assistant-Certified [facility]  09/27/2014 4:52 PM

## 2014-10-22 DIAGNOSIS — F909 Attention-deficit hyperactivity disorder, unspecified type: Secondary | ICD-10-CM | POA: Diagnosis not present

## 2014-10-22 DIAGNOSIS — G894 Chronic pain syndrome: Secondary | ICD-10-CM | POA: Diagnosis not present

## 2014-11-17 DIAGNOSIS — G8929 Other chronic pain: Secondary | ICD-10-CM | POA: Diagnosis not present

## 2014-11-17 DIAGNOSIS — J069 Acute upper respiratory infection, unspecified: Secondary | ICD-10-CM | POA: Diagnosis not present

## 2014-12-14 DIAGNOSIS — Z23 Encounter for immunization: Secondary | ICD-10-CM | POA: Diagnosis not present

## 2014-12-14 DIAGNOSIS — J069 Acute upper respiratory infection, unspecified: Secondary | ICD-10-CM | POA: Diagnosis not present

## 2015-01-13 DIAGNOSIS — K047 Periapical abscess without sinus: Secondary | ICD-10-CM | POA: Diagnosis not present

## 2015-01-28 DIAGNOSIS — G894 Chronic pain syndrome: Secondary | ICD-10-CM | POA: Diagnosis not present

## 2015-01-28 DIAGNOSIS — R41840 Attention and concentration deficit: Secondary | ICD-10-CM | POA: Diagnosis not present

## 2015-02-26 DIAGNOSIS — G894 Chronic pain syndrome: Secondary | ICD-10-CM | POA: Diagnosis not present

## 2015-04-12 ENCOUNTER — Ambulatory Visit (INDEPENDENT_AMBULATORY_CARE_PROVIDER_SITE_OTHER): Payer: Medicare Other | Admitting: Family Medicine

## 2015-04-12 VITALS — BP 124/80 | HR 114 | Temp 97.9°F | Resp 17 | Ht 63.5 in | Wt 214.0 lb

## 2015-04-12 DIAGNOSIS — M1990 Unspecified osteoarthritis, unspecified site: Secondary | ICD-10-CM | POA: Diagnosis not present

## 2015-04-12 DIAGNOSIS — G894 Chronic pain syndrome: Secondary | ICD-10-CM | POA: Diagnosis not present

## 2015-04-12 DIAGNOSIS — G479 Sleep disorder, unspecified: Secondary | ICD-10-CM | POA: Diagnosis not present

## 2015-04-12 DIAGNOSIS — F3170 Bipolar disorder, currently in remission, most recent episode unspecified: Secondary | ICD-10-CM

## 2015-04-12 MED ORDER — VENLAFAXINE HCL ER 150 MG PO CP24: 150.0000 mg | ORAL_CAPSULE | Freq: Every day | ORAL | Status: AC

## 2015-04-12 MED ORDER — ZOLPIDEM TARTRATE 5 MG PO TABS: 5.0000 mg | ORAL_TABLET | Freq: Every evening | ORAL | Status: AC | PRN

## 2015-04-12 MED ORDER — TRAZODONE HCL 150 MG PO TABS: 450.0000 mg | ORAL_TABLET | Freq: Every day | ORAL | Status: AC

## 2015-04-12 MED ORDER — OXYCODONE-ACETAMINOPHEN 5-325 MG PO TABS: 1.0000 | ORAL_TABLET | Freq: Four times a day (QID) | ORAL | Status: AC | PRN

## 2015-04-12 MED ORDER — QUETIAPINE FUMARATE 50 MG PO TABS: 100.0000 mg | ORAL_TABLET | Freq: Every day | ORAL | Status: AC

## 2015-04-12 MED ORDER — GABAPENTIN 300 MG PO CAPS: 300.0000 mg | ORAL_CAPSULE | Freq: Four times a day (QID) | ORAL | Status: AC

## 2015-04-12 NOTE — Patient Instructions (Addendum)
Increase gabapentin to 4 times daily  Minimize use of pain meds  Begin walking daily for exercise  We will not continue refilling your chronic pain medications from practice.  Referral to pain clinic  Follow with your prior doctors or the [place]  Continue to work hard on the weight loss. You to be commended for having lost

## 2015-04-12 NOTE — Progress Notes (Signed)
Patient ID: Christina Rocha, female    DOB: 11/28/1960  Age: 55 y.o. MRN: [id number]  Chief Complaint  Patient presents with  . Medication Refill    seroquel,oxycodeine,ambien     Subjective:   55 year old lady with a history of chronic pain syndrome. She's been on chronic pain medications for many years. She has a long history of being diagnosed with being bipolar. She takes Seroquel for bipolar. She has been getting her medications doctors up in [place], but is in the process of trying to get in with the [place]. She has been on many meds for her pain including gabapentin, Percocet, venlafaxine, trazodone, lorazepam, Ambien for sleep, etc. 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 Otherwise she is very sedentary.  Current allergies, medications, problem list, past/family and social histories reviewed.  Objective:  BP 124/80 mmHg  Pulse 114  Temp(Src) 97.9 F (36.6 C) (Oral)  Resp 17  Ht 5' 3.5" (1.613 m)  Wt 214 lb (97.07 kg)  BMI 37.31 kg/m2  SpO2 94%  Alert and oriented, no major acute distress. Overweight. Neck supple but painful. Chest clear. Heart regular without murmurs. Good range of motion of her shoulders. Poor range of motion of her low back.  Assessment & Plan:   Assessment: 1. Arthritis   2. Chronic pain syndrome   3. Bipolar affective disorder in remission (HCC)   4. Sleep disturbance       Plan: Chronic pain syndrome Bipolar Polypharmacy pain medication Sleep disturbance  No orders of the defined types were placed in this encounter.    Meds ordered this encounter  Medications  . DISCONTD: venlafaxine XR (EFFEXOR-XR) 150 MG 24 hr capsule    Sig: Take 150 mg by mouth daily with breakfast.  . gabapentin (NEURONTIN) 300 MG capsule    Sig: Take 1 capsule (300 mg total) by mouth 4 (four) times daily.    Dispense:  120 capsule    Refill:  1  . oxyCODONE-acetaminophen (PERCOCET/ROXICET) 5-325 MG tablet    Sig: Take 1 tablet by mouth every 6 (six) hours as needed for  severe pain. Reported on 04/12/2015    Dispense:  40 tablet    Refill:  0  . QUEtiapine (SEROQUEL) 50 MG tablet    Sig: Take 2 tablets (100 mg total) by mouth at bedtime. Reported on 04/12/2015    Dispense:  60 tablet    Refill:  1  . zolpidem (AMBIEN) 5 MG tablet    Sig: Take 1 tablet (5 mg total) by mouth at bedtime as needed for sleep. Reported on 04/12/2015    Dispense:  30 tablet    Refill:  1  . venlafaxine XR (EFFEXOR-XR) 150 MG 24 hr capsule    Sig: Take 1 capsule (150 mg total) by mouth daily with breakfast.    Dispense:  30 capsule    Refill:  1  . traZODone (DESYREL) 150 MG tablet    Sig: Take 3 tablets (450 mg total) by mouth at bedtime.    Dispense:  90 tablet    Refill:  1    I explained to her that I will not give her chronic pain medication. I will give her 10 days worth to allow her time to get back to her doctor in [place] [facility] but I will not keep giving pain medication from here. [facility] so I gave enough of her other medicines for 2 months. She wanted to know  what else she could do for pain and I urged her strongly to keep working on weight loss, which she has succeeded at a lot, but also to get regular exercise even though it hurt.     Patient Instructions  Increase gabapentin to 4 times daily  Minimize use of pain meds  Begin walking daily for exercise  We will not continue refilling your chronic pain medications from practice.  Referral to pain clinic  Follow with your prior doctors or the [place]       Return if symptoms worsen or fail to improve.   HOPPER,DAVID, MD 04/12/2015

## 2015-04-20 ENCOUNTER — Telehealth: Payer: Self-pay

## 2015-04-20 NOTE — Telephone Encounter (Signed)
Left message for patient to return call regarding the patient's referral to pain management.  The provider is reviewing the patient's referral and needs the patient's records from her former PCP in [place] within a week, or the referral will be denied.  The patient needs to fill out a release of information at that office and send it to [facility].  [facility] Attention: [facility] [address] [place], [place] [address] Phone: [phone] Fax: [phone]

## 2015-04-26 NOTE — Telephone Encounter (Signed)
This referral is now on hold, per Rose at [facility].  Waiting on patient to return call.

## 2015-05-02 ENCOUNTER — Other Ambulatory Visit: Payer: Self-pay | Admitting: Internal Medicine

## 2015-05-02 DIAGNOSIS — N6310 Unspecified lump in the right breast, unspecified quadrant: Secondary | ICD-10-CM

## 2015-05-11 ENCOUNTER — Other Ambulatory Visit: Payer: Medicare Other

## 2015-05-20 ENCOUNTER — Encounter (HOSPITAL_COMMUNITY): Payer: Self-pay | Admitting: Emergency Medicine

## 2015-05-20 ENCOUNTER — Emergency Department (HOSPITAL_COMMUNITY)
Admission: EM | Admit: 2015-05-20 | Discharge: 2015-05-20 | Disposition: A | Discharge status: Home / Self Care | Payer: Medicare Other | Attending: Emergency Medicine | Admitting: Emergency Medicine

## 2015-05-20 DIAGNOSIS — Y929 Unspecified place or not applicable: Secondary | ICD-10-CM | POA: Insufficient documentation

## 2015-05-20 DIAGNOSIS — Y999 Unspecified external cause status: Secondary | ICD-10-CM | POA: Insufficient documentation

## 2015-05-20 DIAGNOSIS — X150XXA Contact with hot stove (kitchen), initial encounter: Secondary | ICD-10-CM | POA: Diagnosis not present

## 2015-05-20 DIAGNOSIS — T2125XA Burn of second degree of buttock, initial encounter: Secondary | ICD-10-CM | POA: Insufficient documentation

## 2015-05-20 DIAGNOSIS — F319 Bipolar disorder, unspecified: Secondary | ICD-10-CM | POA: Insufficient documentation

## 2015-05-20 DIAGNOSIS — Y939 Activity, unspecified: Secondary | ICD-10-CM | POA: Insufficient documentation

## 2015-05-20 DIAGNOSIS — M1990 Unspecified osteoarthritis, unspecified site: Secondary | ICD-10-CM | POA: Insufficient documentation

## 2015-05-20 DIAGNOSIS — F17210 Nicotine dependence, cigarettes, uncomplicated: Secondary | ICD-10-CM | POA: Insufficient documentation

## 2015-05-20 DIAGNOSIS — T310 Burns involving less than 10% of body surface: Secondary | ICD-10-CM | POA: Diagnosis not present

## 2015-05-20 HISTORY — DX: Disorder of kidney and ureter, unspecified: N28.9

## 2015-05-20 MED ORDER — NAPROXEN 500 MG PO TABS: 500.0000 mg | ORAL_TABLET | Freq: Two times a day (BID) | ORAL | Status: AC

## 2015-05-20 MED ORDER — SILVER SULFADIAZINE 1 % EX CREA: TOPICAL_CREAM | CUTANEOUS | Status: DC

## 2015-05-20 MED ORDER — KETOROLAC TROMETHAMINE 60 MG/2ML IM SOLN
60.0000 mg | Freq: Once | INTRAMUSCULAR | Status: AC
  Administered 2015-05-20: 60 mg via INTRAMUSCULAR
  Filled 2015-05-20: qty 2

## 2015-05-20 MED ORDER — TETANUS-DIPHTH-ACELL PERTUSSIS 5-2.5-18.5 LF-MCG/0.5 IM SUSP
0.5000 mL | Freq: Once | INTRAMUSCULAR | Status: AC
  Administered 2015-05-20: 0.5 mL via INTRAMUSCULAR
  Filled 2015-05-20: qty 0.5

## 2015-05-20 NOTE — Discharge Instructions (Signed)
Second-Degree Burn °A second-degree burn affects the 2 outer layers of skin. The outer layer (epidermis) and the layer underneath it (dermis) are both burned. Another name for this type of burn is a partial thickness burn. A second-degree burn may be called minor or major. This depends on the size of the burn. It also depends on what parts of the skin are burned. Minor burns may be treated with first aid. Major burns are a medical emergency. °A second-degree burn is worse than a first-degree burn, but not as bad as a third-degree burn. A first-degree burn affects only the epidermis. A third-degree burn goes through all the layers of skin. A second-degree burn usually heals in 3 to 4 weeks. A minor second-degree burn usually does not leave a scar. Deeper second-degree burns may lead to scarring of the skin or contractures over joints. Contractures are scars that form over joints and may lead to reduced mobility at those joints. °CAUSES °· Heat (thermal) injury. This happens when skin comes in contact with something very hot. It could be a flame, a hot object, hot liquid, or steam. Most second-degree burns are thermal injuries. °· Radiation. Sunlight is one type of radiation that can burn the skin. Another type of radiation is used to heat food. Radiation is also used to treat some diseases, such as cancer. All types of radiation can burn the skin. Sunlight usually causes a first-degree burn. Radiation used for heating food or treating a disease can cause a second-degree burn. °· Electricity. Electrical burns can cause more damage under the skin than on the surface. They should always be treated as major burns. °· Chemicals. Many chemicals can burn the skin. The burn should be flushed with cool water and checked by an emergency caregiver. °SYMPTOMS °Symptoms of second-degree burns include: °· Severe pain. °· Extreme tenderness. °· Deep redness. °· Blistered skin. °· Skin that has changed color. It might look blotchy,  wet, or shiny. °· Swelling. °TREATMENT °Some second-degree burns may need to be treated in a hospital. These include major burns, electrical burns, and chemical burns. Many other second-degree burns can be treated with regular first aid, such as: °· Cooling the burn. Use cool, germ-free (sterile) salt water. Place the burned area of skin into a tub of water, or cover the burned area with clean, wet towels. °· Taking pain medicine. °· Removing the dead skin from broken blisters. A trained caregiver may do this. Do not pop blisters. °· Gently washing your skin with mild soap. °· Covering the burned area with a cream. Silver sulfadiazine is a cream for burns. An antibiotic cream, such as bacitracin, may also be used to fight infection. Do not use other ointments or creams unless your caregiver says it is okay. °· Protecting the burn with a sterile, non-sticky bandage. °· Bandaging fingers and toes separately. This keeps them from sticking together. °· Taking an antibiotic. This can help prevent infection. °· Getting a tetanus shot. °HOME CARE INSTRUCTIONS °Medication °· Take any medicine prescribed by your caregiver. Follow the directions carefully. °· Ask your caregiver if you can take over-the-counter medicine to relieve pain and swelling. Do not give aspirin to children. °· Make sure your caregiver knows about all other medicines you take. This includes over-the-counter medicines. °Burn care °· You will need to change the bandage on your burn. You may need to do this 2 or 3 times each day. °¨ Gently clean the burned area. °¨ Put ointment on it. °¨ Cover the burn with a sterile bandage. °·   For some deeper burns or burns that cover a large area, compression garments may be prescribed. These garments can help minimize scarring and protect your mobility.  Do not put butter or oil on your skin. Use only the cream prescribed by your caregiver.  Do not put ice on your burn.  Do not break blisters on your  skin.  Keep the bandaged area dry. You might need to take a sponge bath for awhile.Ask your caregiver when you can take a shower or a tub bath again.  Do not scratch an itchy burn. Your caregiver may give you medicine to relieve very bad itching.  Infection is a big danger after a second-degree burn. Tell your caregiver right away if you have signs of infection, such as:  Redness or changing color in the burned area.  Fluid leaking from the burn.  Swelling in the burn area.  A bad smell coming from the wound. Follow-up  Keep all follow-up appointments.This is important. This is how your caregiver can tell if your treatment is working.  Protect your burn from sunlight.Use sunscreen whenever you go outside.Burned areas may be sensitive to the sun for up to 1 year. Exposure to the sun may also cause permanent darkening of scars. SEEK MEDICAL CARE IF:  You have any questions about medicines.  You have any questions about your treatment.  You wonder if it is okay to do a particular activity.  You develop a fever of more than 100.5 F (38.1 C). SEEK IMMEDIATE MEDICAL CARE IF:  You think your burn might be infected. It may change color, become red, leak fluid, swell, or smell bad.  You develop a fever of more than 102 F (38.9 C).   This information is not intended to replace advice given to you by your health care provider. Make sure you discuss any questions you have with your health care provider.   Document Released: 07/31/2010 Document Revised: 05/21/2011 Document Reviewed: 07/31/2010 Elsevier Interactive Patient Education 2016 [employer].  Burn Care Burns hurt your skin. When your skin is hurt, it is easier to get an infection. Follow your doctor's directions to help prevent an infection. HOME CARE  Wash your hands well before you change your bandage.  Change your bandage as often as told by your doctor.  Remove the old bandage. If the bandage sticks, soak it  off with cool, clean water.  Gently clean the burn with mild soap and water.  Pat the burn dry with a clean, dry cloth.  Put a thin layer of medicated cream on the burn.  Put a clean bandage on as told by your doctor.  Keep the bandage clean and dry.  Raise (elevate) the burn for the first 24 hours. After that, follow your doctor's directions.  Only take medicine as told by your doctor. GET HELP RIGHT AWAY IF:   You have too much pain.  The skin near the burn is red, tender, puffy (swollen), or has red streaks.  The burn area has yellowish white fluid (pus) or a bad smell coming from it.  You have a fever. MAKE SURE YOU:   Understand these instructions.  Will watch your condition.  Will get help right away if you are not doing well or get worse.   This information is not intended to replace advice given to you by your health care provider. Make sure you discuss any questions you have with your health care provider.   Document Released: 12/06/2007 Document Revised: 05/21/2011  Document Reviewed: 07/19/2010 Elsevier Interactive Patient Education [employer] Elsevier Inc.
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
## 2015-05-20 NOTE — ED Notes (Signed)
Xeroform dressing applied and SO educated on dressing changes and wound care. SO expressed understanding.
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
## 2015-06-14 ENCOUNTER — Emergency Department (HOSPITAL_COMMUNITY)
Admission: EM | Admit: 2015-06-14 | Discharge: 2015-06-14 | Disposition: A | Discharge status: Home / Self Care | Payer: Medicare Other | Attending: Emergency Medicine | Admitting: Emergency Medicine

## 2015-06-14 ENCOUNTER — Encounter (HOSPITAL_COMMUNITY): Payer: Self-pay | Admitting: Emergency Medicine

## 2015-06-14 DIAGNOSIS — R234 Changes in skin texture: Secondary | ICD-10-CM | POA: Diagnosis not present

## 2015-06-14 DIAGNOSIS — M1990 Unspecified osteoarthritis, unspecified site: Secondary | ICD-10-CM | POA: Insufficient documentation

## 2015-06-14 DIAGNOSIS — F319 Bipolar disorder, unspecified: Secondary | ICD-10-CM | POA: Insufficient documentation

## 2015-06-14 DIAGNOSIS — F17210 Nicotine dependence, cigarettes, uncomplicated: Secondary | ICD-10-CM | POA: Diagnosis not present

## 2015-06-14 DIAGNOSIS — T2125XA Burn of second degree of buttock, initial encounter: Secondary | ICD-10-CM | POA: Diagnosis not present

## 2015-06-14 DIAGNOSIS — T310 Burns involving less than 10% of body surface: Secondary | ICD-10-CM | POA: Diagnosis not present

## 2015-06-14 DIAGNOSIS — Z4801 Encounter for change or removal of surgical wound dressing: Secondary | ICD-10-CM | POA: Diagnosis present

## 2015-06-14 MED ORDER — SILVER SULFADIAZINE 1 % EX CREA: 1.0000 "application " | TOPICAL_CREAM | Freq: Two times a day (BID) | CUTANEOUS | Status: AC

## 2015-06-14 MED ORDER — ETODOLAC 300 MG PO CAPS: 300.0000 mg | ORAL_CAPSULE | Freq: Three times a day (TID) | ORAL | Status: AC
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
## 2015-06-14 NOTE — ED Notes (Signed)
Wound dressed. ?

## 2015-06-14 NOTE — Discharge Instructions (Signed)
Burn Care °Your skin is a natural barrier to infection. It is the largest organ of your body. Burns damage this natural protection. To help prevent infection, it is very important to follow your caregiver's instructions in the care of your burn. °Burns are classified as: °· First degree. There is only redness of the skin (erythema). No scarring is expected. °· Second degree. There is blistering of the skin. Scarring may occur with deeper burns. °· Third degree. All layers of the skin are injured, and scarring is expected. °HOME CARE INSTRUCTIONS  °· Wash your hands well before changing your bandage. °· Change your bandage as often as directed by your caregiver. °¨ Remove the old bandage. If the bandage sticks, you may soak it off with cool, clean water. °¨ Cleanse the burn thoroughly but gently with mild soap and water. °¨ Pat the area dry with a clean, dry cloth. °¨ Apply a thin layer of antibacterial cream to the burn. °¨ Apply a clean bandage as instructed by your caregiver. °¨ Keep the bandage as clean and dry as possible. °· Elevate the affected area for the first 24 hours, then as instructed by your caregiver. °· Only take over-the-counter or prescription medicines for pain, discomfort, or fever as directed by your caregiver. °SEEK IMMEDIATE MEDICAL CARE IF:  °· You develop excessive pain. °· You develop redness, tenderness, swelling, or red streaks near the burn. °· The burned area develops yellowish-white fluid (pus) or a bad smell. °· You have a fever. °MAKE SURE YOU:  °· Understand these instructions. °· Will watch your condition. °· Will get help right away if you are not doing well or get worse. °  °This information is not intended to replace advice given to you by your health care provider. Make sure you discuss any questions you have with your health care provider. °  °Document Released: 02/26/2005 Document Revised: 05/21/2011 Document Reviewed: 07/19/2010 °[employer]vier Interactive Patient Education ©2016  [employer] ° °
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
## 2015-07-11 DEATH — deceased
